# Patient Record
Sex: Female | Born: 1955 | Race: Black or African American | Hispanic: No | Smoking: Never smoker
Health system: Southern US, Community
[De-identification: ages and names within clinical notes are randomized; demographics above are authoritative.]

## PROBLEM LIST (undated history)

## (undated) DIAGNOSIS — Z9889 Other specified postprocedural states: Secondary | ICD-10-CM

## (undated) DIAGNOSIS — R7303 Prediabetes: Secondary | ICD-10-CM

## (undated) DIAGNOSIS — R51 Headache: Secondary | ICD-10-CM

## (undated) DIAGNOSIS — Z973 Presence of spectacles and contact lenses: Secondary | ICD-10-CM

## (undated) DIAGNOSIS — I10 Essential (primary) hypertension: Secondary | ICD-10-CM

## (undated) DIAGNOSIS — IMO0001 Reserved for inherently not codable concepts without codable children: Secondary | ICD-10-CM

## (undated) DIAGNOSIS — T8859XA Other complications of anesthesia, initial encounter: Secondary | ICD-10-CM

## (undated) DIAGNOSIS — R519 Headache, unspecified: Secondary | ICD-10-CM

## (undated) DIAGNOSIS — M1990 Unspecified osteoarthritis, unspecified site: Secondary | ICD-10-CM

## (undated) HISTORY — PX: NO PAST SURGERIES: SHX2092

---

## 2015-04-12 ENCOUNTER — Emergency Department (HOSPITAL_COMMUNITY)
Admission: EM | Admit: 2015-04-12 | Discharge: 2015-04-12 | Disposition: A | Discharge status: Home / Self Care | Payer: Medicaid Other | Attending: Emergency Medicine | Admitting: Emergency Medicine

## 2015-04-12 ENCOUNTER — Encounter (HOSPITAL_COMMUNITY): Payer: Self-pay | Admitting: Emergency Medicine

## 2015-04-12 ENCOUNTER — Emergency Department (HOSPITAL_COMMUNITY): Payer: Medicaid Other

## 2015-04-12 DIAGNOSIS — I10 Essential (primary) hypertension: Secondary | ICD-10-CM | POA: Diagnosis not present

## 2015-04-12 DIAGNOSIS — Z79899 Other long term (current) drug therapy: Secondary | ICD-10-CM | POA: Diagnosis not present

## 2015-04-12 DIAGNOSIS — M1711 Unilateral primary osteoarthritis, right knee: Secondary | ICD-10-CM

## 2015-04-12 DIAGNOSIS — Z880 Allergy status to penicillin: Secondary | ICD-10-CM | POA: Diagnosis not present

## 2015-04-12 DIAGNOSIS — M25561 Pain in right knee: Secondary | ICD-10-CM | POA: Diagnosis present

## 2015-04-12 DIAGNOSIS — M179 Osteoarthritis of knee, unspecified: Secondary | ICD-10-CM | POA: Diagnosis not present

## 2015-04-12 HISTORY — DX: Essential (primary) hypertension: I10

## 2015-04-12 MED ORDER — TRAMADOL HCL 50 MG PO TABS: 50.0000 mg | ORAL_TABLET | Freq: Four times a day (QID) | ORAL | Status: DC | PRN

## 2015-04-12 MED ORDER — CLONIDINE HCL 0.2 MG PO TABS
0.2000 mg | ORAL_TABLET | Freq: Once | ORAL | Status: AC
  Administered 2015-04-12: 0.2 mg via ORAL
  Filled 2015-04-12: qty 1

## 2015-04-12 MED ORDER — HYDROCODONE-ACETAMINOPHEN 5-325 MG PO TABS
1.0000 | ORAL_TABLET | ORAL | Status: AC
Start: 2015-04-12 — End: 2015-04-12
  Administered 2015-04-12: 1 via ORAL
  Filled 2015-04-12: qty 1

## 2015-04-12 NOTE — Discharge Instructions (Signed)
Emergency Department Resource Guide 1) Find a Doctor and Pay Out of Pocket Although you won't have to find out who is covered by your insurance plan, it is a good idea to ask around and get recommendations. You will then need to call the office and see if the doctor you have chosen will accept you as a new patient and what types of options they offer for patients who are self-pay. Some doctors offer discounts or will set up payment plans for their patients who do not have insurance, but you will need to ask so you aren't surprised when you get to your appointment.  2) Contact Your Local Health Department Not all health departments have doctors that can see patients for sick visits, but many do, so it is worth a call to see if yours does. If you don't know where your local health department is, you can check in your phone book. The CDC also has a tool to help you locate your state's health department, and many state websites also have listings of all of their local health departments.  3) Find a [facility] If your illness is not likely to be very severe or complicated, you may want to try a walk in clinic. These are popping up all over the country in pharmacies, drugstores, and shopping centers. They're usually staffed by nurse practitioners or physician assistants that have been trained to treat common illnesses and complaints. They're usually fairly quick and inexpensive. However, if you have serious medical issues or chronic medical problems, these are probably not your best option.   Chronic Pain Problems: Organization         Address     Phone             Notes  [facility]  [phone] Patients need to be referred by their primary care doctor.   Medication Assistance: Organization         Address     Phone             Notes  [facility] Medication [facility] [address] [place], [place] [address] [phone] --Must be a resident of  [facility] -- Must have NO insurance coverage whatsoever (no Medicaid/ Medicare, etc.) -- The pt. MUST have a primary care doctor that directs their care regularly and follows them in the community   Med[facility]ist  [phone]   [employer]  [phone]    Agencies that provide inexpensive medical care: Organization         Address     Phone             Notes  [place]  [phone]   [facility]    [phone]   [facility] [place], [place] [address] [phone]   [place] [address] [place] [phone]   Planned Parenthood    [phone]   [facility]    [phone]   [place] and [address], [place] Phone:  [phone], Fax:  [phone] Hours of Operation:  9 am - 6 pm, M-F.  Also accepts Medicaid/Medicare and self-pay.  [facility] for [place], [address], [place] Phone: [phone], Fax: [phone]. Hours of Operation:  8:30 am - 5:30 pm, M-F.  Also accepts Medicaid and self-pay.  [facility] [place] [address] [place] Phone: [phone]   [place], [place], [place] [phone], Ext. [phone] Mondays & Thursdays: 7-9 AM.  First 15 patients are seen on a first come, first serve basis.   [facility] [address] [place] [address] [phone] Accepts Medicaid   [place] Providers:  Organization         Address     Phone             Notes  [facility] [address], [place] [phone] Also accepts self-pay patients.  [facility] [id number] [place], [place]  [phone]   [place], [address], [place] [phone]   [facility]  [address], [place] [phone]   Lucianne Lei [address], [place]   [phone] Only accepts [place] Access [place] patients after they have their name applied to their card.   Self-Pay (no insurance) in [facility]:  Organization         Address     Phone             Notes  Sickle Cell Patients, [facility] [place] [phone]   [facility] [place] [phone]   [facility] [place]  [place], [place], [place] [phone]   [facility]/Dr. Osei-Bonsu  [address] [place] or [address], [place] [phone] Phone number for both [place] and [place] locations is the same.  Urgent Medical and [facility] [address] [place] [phone]   [facility] [address], [place] or [address] [phone]   [facility] [address] [place] [phone], phone; [phone], fax Sees patients 1st and 3rd Saturday of every month.  Must not qualify for public or private insurance (i.e. Medicaid, Medicare, [place] Health Choice, Veterans' Benefits)  Household income should be no more than 200% of the poverty level The clinic cannot treat you if you are pregnant or think you are pregnant  Sexually transmitted diseases are not treated at the clinic.    Dental Care:  Organization         Address     Phone             Notes  [facility] Department of [facility] [place] [phone] Accepts children up to age 37 who are enrolled in [place] or [place]; pregnant women with a Medicaid card; and children who have applied for Medicaid or [place] Health Choice, but were declined, whose parents can pay a reduced fee at time of service.  [facility] Department of [facility]  [address], [place] [phone] Accepts children up to age 36 who are enrolled in [place] or [place]; pregnant women with a Medicaid card; and children who have applied for Medicaid or [place] Health Choice, but were declined, whose parents can pay a reduced fee at time of service.  [facility] Adult [facility] Access PROGRAM  [place] [phone] Patients are seen by  appointment only. Walk-ins are not accepted. [place] will see patients 61 years of age and older. Monday - Tuesday (8am-5pm) Most Wednesdays (8:30-5pm) $30 per visit, cash only  [facility] Adult Dental Access PROGRAM  [address], [facility] [phone] Patients are seen by appointment only. Walk-ins are not accepted. [place] will see patients 46 years of age and older. One Wednesday Evening (Monthly: Volunteer Based).  $30 per visit, cash only  [place]  [phone] for adults; Children under age 22, call Graduate Pediatric Dentistry at [phone]. Children aged 70-14, please call [phone] to request a pediatric application.  Dental services are provided in all areas of dental care including fillings, crowns and bridges, complete and partial dentures, implants, gum treatment, root canals, and extractions. Preventive care is also provided. Treatment is provided to both adults and children. Patients are selected via a lottery and there is often a waiting list.   [facility] [address] [place]  [phone] [email or web address]   [facility] [address] [place], [place] [phone], Ext. [phone] Second and Fourth Thursday of each month, opens at 6:30 AM; Clinic ends at 9 AM.  Patients are seen on a first-come first-served basis, and a limited number are seen during each clinic.   [facility]  [address] [place], [place] [phone]   Eligibility Requirements You  must have lived in [place], [place], or [place] counties for at least the last three months.   You cannot be eligible for state or federal sponsored [employer], including [employer], [place], or [employer].   You generally cannot be eligible for healthcare insurance through your employer.    How to apply: Eligibility screenings are held every Tuesday and Wednesday afternoon from 1:00 pm until 4:00 pm. You do not need an appointment for the interview!  [facility] [address], [place], [place]   [place]  [place] Department  [place]  [phone]    Behavioral Health Resources in the Community: Intensive Outpatient Programs Organization         Address     Phone             Notes  [place]. [address] [place], [place] [phone]   [facility] Outpatient [address], [place], [place]   ADS: Alcohol & Drug Svcs [address] [place], [place]   [place] [address]  [place], [place] or [phone]     Substance Abuse Resources Organization         Address     Phone             Notes  Alcohol and Drug Services  [phone]   [place]  [phone]   [facility] [place]   Chinita Pester  [phone]   Residential & Outpatient Substance Abuse Program  [phone]   Psychological Services Organization         Address     Phone             Notes  [facility] [place]  [phone]   [place] [address] [place] or [phone]    Mobile Crisis Teams Organization  Address     Phone             Notes  [facility]rapeutic Alternatives, Mobile Crisis Care Unit  [phone]   Assertive Psychotherapeutic  Services  [address] [place], [place]   [facility] [address], [place] [phone]    Self-Help/Support Groups Organization         Address     Phone             Notes  [place]. of [place] - variety of support groups  [place] Call for more information  Narcotics Anonymous (NA), Caring Services [address], [employer] [place]  2 meetings at this location   Special educational needs teacher         Address     Phone             Notes  ASAP Residential Treatment [place],    [place]  [phone]   [facility]  [address], [place] [id number], [place], [place]   [place], [place] [phone] Admissions: 8am-3pm M-F  Incentives Substance [place] [address],    [place], [place] [phone]   [facility] [address] [place], [place], [place]   [facility] [address]  [place], [place]   Insight Programs - Intensive Outpatient [address] Kristeen [address], [place], [place]   [facility] ([place].) [place].,  [place], [place] [phone] or [phone]   Residential Treatment Services (RTS) [address] [place], [place] Accepts Medicaid  [facility] [place] [address]  [place] [phone] Substance Abuse/Addiction Treatment   [facility] Organization         Address     Phone             Notes  [facility]  [phone]   Domenic Schwab, PhD [address] [place], [place]   [phone] or [phone]   [place], [place] [phone]   [facility] [address], [place], [place] [phone] Insurance/Medicaid/sponsorship through [facility] and Families [address] [place]                                     [place], [place] [phone] [place] [address], [place] [phone]    Dr. Adele Schilder  [phone]   [facility] Dept. 1) [address] [place] 2) [place] 3)  [place] [address], [place] [phone]  [phone]   [place] [phone] or [phone] (After Hours)     [place]: Abuse and Market researcher         Address     Phone             Notes  Child/Elder Abuse Hotline  413-460-9469   Family Abuse Services  [phone] 24 hour crisis line  [place] Substance Use Organization  Address     Phone             Notes  [employer]   [phone] 24 hour crisis line  Advance Access  [address], [place] [phone] Monday- Friday, walk-in,  8am-8pm  RTSA Detoxification & Crisis Stabilization  [phone]   Alcoholics Anonymous [phone] [place] [place] [employer]  Narcotics Anonymous  [phone]     Health Clinics & Urgent Care Centers Organization         Address     Phone             Notes  [facility] [facility] Health Department  [phone]   [facility]  [phone]   [facility]  [phone]   [facility]  7703185107 Uninsured patients meeting eligibility requirement  [facility] Health Services      [facility]  [phone]      [place]     Health Center  [phone]      [facility]  [phone]      [facility]   [phone]     [facility]    Center  [phone]     Additional [facility] Resources Organization          Address     Phone             Notes  [facility]  [phone]   [place]  810-175-1025 Medicaid, Nutrition, Medicine Assistance, Utility Assistance  [employer] Authority (702)576-8538   [place] [facility]  814-756-4999   [facility]  434-347-1804 [facility] Shelter  [facility] [place]  712-458-0998 Adult & family shelter, food, utility & rent assistance  24 Hour crisis line for those facing homelessness  314-703-6201   [facility] Transit  507-046-8178 [place], [facility] public transportation system  Homecare Providers  [phone] HIV/AIDS Case Management, FREE HIV SCREEN  Medication Management  [phone] Ongoing medication assistance for patients meeting eligibility requirements  Medication Drop Box Locations: [place] Police Dept., [facility] Police Dept., [employer] Police Dept., [facility] office  Safely rid of unused medications  [employer]  ([phone])860-4326 Crisis assistance, medication, housing, food, utility assistance  [employer] Info.  [facility])  [phone]          Hypertension Hypertension, commonly called high blood pressure, is when the force of blood pumping through your arteries is too strong. Your arteries are the blood vessels that carry blood from your heart throughout your body. A blood pressure reading consists of a higher number over a lower number, such as 110/72. The higher number (systolic) is the pressure inside your arteries when your heart pumps. The lower number (diastolic) is the pressure inside your arteries when your heart relaxes. Ideally you want your blood pressure below 120/80. Hypertension forces your heart to work harder to pump blood. Your arteries may become narrow or stiff. Having hypertension puts you at risk for heart disease, stroke, and other problems.  RISK FACTORS Some risk factors for high blood  pressure are controllable. Others are not.  Risk factors you cannot control include:   Race. You may be at higher risk if you are African American.  Age. Risk increases with age.  Gender. Men are at higher risk than women before age 34 years. After age 77, women are at higher  risk than men. Risk factors you can control include:  Not getting enough exercise or physical activity.  Being overweight.  Getting too much fat, sugar, calories, or salt in your diet.  Drinking too much alcohol. SIGNS AND SYMPTOMS Hypertension does not usually cause signs or symptoms. Extremely high blood pressure (hypertensive crisis) may cause headache, anxiety, shortness of breath, and nosebleed. DIAGNOSIS  To check if you have hypertension, your health care provider will measure your blood pressure while you are seated, with your arm held at the level of your heart. It should be measured at least twice using the same arm. Certain conditions can cause a difference in blood pressure between your right and left arms. A blood pressure reading that is higher than normal on one occasion does not mean that you need treatment. If one blood pressure reading is high, ask your health care provider about having it checked again. TREATMENT  Treating high blood pressure includes making lifestyle changes and possibly taking medicine. Living a healthy lifestyle can help lower high blood pressure. You may need to change some of your habits. Lifestyle changes may include:  Following the DASH diet. This diet is high in fruits, vegetables, and whole grains. It is low in salt, red meat, and added sugars.  Getting at least 2 hours of brisk physical activity every week.  Losing weight if necessary.  Not smoking.  Limiting alcoholic beverages.  Learning ways to reduce stress. If lifestyle changes are not enough to get your blood pressure under control, your health care provider may prescribe medicine. You may need to take  more than one. Work closely with your health care provider to understand the risks and benefits. HOME CARE INSTRUCTIONS  Have your blood pressure rechecked as directed by your health care provider.   Take medicines only as directed by your health care provider. Follow the directions carefully. Blood pressure medicines must be taken as prescribed. The medicine does not work as well when you skip doses. Skipping doses also puts you at risk for problems.   Do not smoke.   Monitor your blood pressure at home as directed by your health care provider. SEEK MEDICAL CARE IF:   You think you are having a reaction to medicines taken.  You have recurrent headaches or feel dizzy.  You have swelling in your ankles.  You have trouble with your vision. SEEK IMMEDIATE MEDICAL CARE IF:  You develop a severe headache or confusion.  You have unusual weakness, numbness, or feel faint.  You have severe chest or abdominal pain.  You vomit repeatedly.  You have trouble breathing. MAKE SURE YOU:   Understand these instructions.  Will watch your condition.  Will get help right away if you are not doing well or get worse. Document Released: 10/27/2005 Document Revised: 03/13/2014 Document Reviewed: 08/19/2013 [facility] Patient Information 2015 [place], [place]. This information is not intended to replace advice given to you by your health care provider. Make sure you discuss any questions you have with your health care provider. Osteoarthritis Osteoarthritis is a disease that causes soreness and inflammation of a joint. It occurs when the cartilage at the affected joint wears down. Cartilage acts as a cushion, covering the ends of bones where they meet to form a joint. Osteoarthritis is the most common form of arthritis. It often occurs in older people. The joints affected most often by this condition include those in the:  Ends of the fingers.  Thumbs.  Neck.  Lower  back.  Knees.  Hips. CAUSES  Over time, the cartilage that covers the ends of bones begins to wear away. This causes bone to rub on bone, producing pain and stiffness in the affected joints.  RISK FACTORS Certain factors can increase your chances of having osteoarthritis, including:  Older age.  Excessive body weight.  Overuse of joints.  Previous joint injury. SIGNS AND SYMPTOMS   Pain, swelling, and stiffness in the joint.  Over time, the joint may lose its normal shape.  Small deposits of bone (osteophytes) may grow on the edges of the joint.  Bits of bone or cartilage can break off and float inside the joint space. This may cause more pain and damage. DIAGNOSIS  Your health care provider will do a physical exam and ask about your symptoms. Various tests may be ordered, such as:  X-rays of the affected joint.  An MRI scan.  Blood tests to rule out other types of arthritis.  Joint fluid tests. This involves using a needle to draw fluid from the joint and examining the fluid under a microscope. TREATMENT  Goals of treatment are to control pain and improve joint function. Treatment plans may include:  A prescribed exercise program that allows for rest and joint relief.  A weight control plan.  Pain relief techniques, such as:  Properly applied heat and cold.  Electric pulses delivered to nerve endings under the skin (transcutaneous electrical nerve stimulation [TENS]).  Massage.  Certain nutritional supplements.  Medicines to control pain, such as:  Acetaminophen.  Nonsteroidal anti-inflammatory drugs (NSAIDs), such as naproxen.  Narcotic or central-acting agents, such as tramadol.  Corticosteroids. These can be given orally or as an injection.  Surgery to reposition the bones and relieve pain (osteotomy) or to remove loose pieces of bone and cartilage. Joint replacement may be needed in advanced states of osteoarthritis. HOME CARE INSTRUCTIONS   Take  medicines only as directed by your health care provider.  Maintain a healthy weight. Follow your health care provider's instructions for weight control. This may include dietary instructions.  Exercise as directed. Your health care provider can recommend specific types of exercise. These may include:  Strengthening exercises. These are done to strengthen the muscles that support joints affected by arthritis. They can be performed with weights or with exercise bands to add resistance.  Aerobic activities. These are exercises, such as brisk walking or low-impact aerobics, that get your heart pumping.  Range-of-motion activities. These keep your joints limber.  Balance and agility exercises. These help you maintain daily living skills.  Rest your affected joints as directed by your health care provider.  Keep all follow-up visits as directed by your health care provider. SEEK MEDICAL CARE IF:   Your skin turns red.  You develop a rash in addition to your joint pain.  You have worsening joint pain.  You have a fever along with joint or muscle aches. SEEK IMMEDIATE MEDICAL CARE IF:  You have a significant loss of weight or appetite.  You have night sweats. FOR MORE INFORMATION   National Institute of Arthritis and Musculoskeletal and Skin Diseases: www.niams.http://www.myers.net/  [employer] on Aging: https://walker.com/  American College of Rheumatology: www.rheumatology.org Document Released: 10/27/2005 Document Revised: 03/13/2014 Document Reviewed: 07/04/2013 [facility] Patient Information 2015 [place], [place]. This information is not intended to replace advice given to you by your health care provider. Make sure you discuss any questions you have with your health care provider.

## 2015-04-12 NOTE — ED Notes (Signed)
Pt reports she injured her right knee five years ago, pt reports increase in pain today, denies new injury. Pt reports knot to her right outside knee. Pt is able to bare weight on her right leg however it is painful. Pt reports a HA as well since her knee started bothering her today. Hx of HTN, states she is on 4 different BP medications.

## 2015-04-12 NOTE — ED Provider Notes (Signed)
CSN: [id number]     Arrival date & time 04/12/15  1753 History   First MD Initiated Contact with Patient 04/12/15 1954     Chief Complaint  Patient presents with  . Knee Pain  . Headache    HPI Comments: Pain started several years ago.    Patient is a 59 y.o. female presenting with knee pain. The history is provided by the patient.  Knee Pain Location:  Knee Injury: no   Pain details:    Quality:  Aching and dull   Radiates to:  Does not radiate   Severity:  Moderate   Onset quality:  Gradual   Duration: Pain has been off and on for several years but significantly worse in the last week.   Timing:  Constant   Progression:  Worsening Relieved by:  Rest Worsened by:  Activity and bearing weight Ineffective treatments:  None tried Associated symptoms: stiffness   Associated symptoms: no muscle weakness and no numbness    the patient has also noticed a mild headache today. She does have a history of hypertension. She takes several medications including amlodipine clonidine hydrochlorothiazide and lisinopril  Past Medical History  Diagnosis Date  . Hypertension    History reviewed. No pertinent past surgical history. No family history on file. History  Substance Use Topics  . Smoking status: Never Smoker   . Smokeless tobacco: Not on file  . Alcohol Use: Yes   OB History    No data available     Review of Systems  Musculoskeletal: Positive for stiffness.  All other systems reviewed and are negative.     Allergies  Penicillins  Home Medications   Prior to Admission medications   Medication Sig Start Date End Date Taking? Authorizing Provider  acetaminophen (TYLENOL) 500 MG tablet Take 1,000 mg by mouth every 6 (six) hours as needed for mild pain.   Yes Historical Provider, MD  amLODipine (NORVASC) 10 MG tablet Take 10 mg by mouth daily.   Yes Historical Provider, MD  cloNIDine (CATAPRES) 0.2 MG tablet Take 0.2 mg by mouth 2 (two) times daily.   Yes Historical  Provider, MD  hydrochlorothiazide (HYDRODIURIL) 25 MG tablet Take 25 mg by mouth daily.   Yes Historical Provider, MD  lisinopril (PRINIVIL,ZESTRIL) 20 MG tablet Take 20 mg by mouth daily.   Yes Historical Provider, MD   BP 208/95 mmHg  Pulse 91  Temp(Src) 98.3 F (36.8 C) (Oral)  Resp 18  Ht  (1.727 m)  Wt 320 lb (145.151 kg)  BMI 48.67 kg/m2  SpO2 95% Physical Exam  Constitutional: She appears well-developed and well-nourished. No distress.  HENT:  Head: Normocephalic and atraumatic.  Right Ear: External ear normal.  Left Ear: External ear normal.  Eyes: Conjunctivae are normal. Right eye exhibits no discharge. Left eye exhibits no discharge. No scleral icterus.  Neck: Neck supple. No tracheal deviation present.  Cardiovascular: Normal rate, regular rhythm and intact distal pulses.   Pulmonary/Chest: Effort normal and breath sounds normal. No stridor. No respiratory distress. She has no wheezes. She has no rales.  Abdominal: Soft. Bowel sounds are normal. She exhibits no distension. There is no tenderness. There is no rebound and no guarding.  Musculoskeletal: She exhibits tenderness. She exhibits no edema.       Right knee: She exhibits no erythema and no LCL laxity. Tenderness found. Medial joint line and lateral joint line tenderness noted.  Neurological: She is alert. She has normal strength. No cranial nerve  deficit (no facial droop, extraocular movements intact, no slurred speech) or sensory deficit. She exhibits normal muscle tone. She displays no seizure activity. Coordination normal.  Skin: Skin is warm and dry. No rash noted.  Psychiatric: She has a normal mood and affect.  Nursing note and vitals reviewed.   ED Course  Procedures (including critical care time) Labs Review Labs Reviewed - No data to display  Imaging Review Dg Knee Complete 4 Views Right  04/12/2015   CLINICAL DATA:  Knee effusion.  Unable to bear weight.  EXAM: RIGHT KNEE - COMPLETE 4+ VIEW   COMPARISON:  None.  FINDINGS: Severe tricompartmental osteoarthritis, especially in the patellofemoral joint and medial compartment where there is severe loss of articular space. Prominent tricompartmental spurring.  Moderate knee effusion in the suprapatellar bursa. Suspected distal femoral osteochondroma of the anterior metaphysis. Possible free osteochondral fragment loose in the knee joint posterolaterally.  IMPRESSION: 1. Severe osteoarthritis. 2. Moderate knee effusion. 3. 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 4. Cannot exclude free osteochondral fragment in the knee joint.   Electronically Signed   By: Gaylyn RongWalter  Liebkemann M.D.   On: 04/12/2015 19:10     EKG Interpretation None      MDM   Final diagnoses:  Osteoarthritis of right knee, unspecified osteoarthritis type  Essential hypertension    Patient's x-ray show she has severe degenerative joint disease. Patient is morbidly obese and likely contributing to her knee arthritis.  Patient also has a mild headache but she is not showing any abnormalities in her neurologic exam. She does not have any meningismus. I doubt meningitis, stroke or cerebral hemorrhage.  The patient's blood pressure was initially elevated at 208. Her most recent blood pressure is improved at 153/74. Patient does not have a primary care doctor in this area. I will provide referral to a local primary care provider.  I will given him an orthopedic doctor to follow up with as needed.   Linwood DibblesJon Enyah Moman, MD 04/12/15 2046

## 2015-05-25 LAB — POCT GLUCOSE (DEVICE FOR HOME USE): Glucose Fasting, POC: 91 mg/dL (ref 70–99)

## 2015-07-02 ENCOUNTER — Encounter: Payer: Self-pay | Admitting: Dietician

## 2015-07-02 ENCOUNTER — Encounter: Payer: Medicaid Other | Attending: Family Medicine | Admitting: Dietician

## 2015-07-02 DIAGNOSIS — Z713 Dietary counseling and surveillance: Secondary | ICD-10-CM | POA: Insufficient documentation

## 2015-07-02 DIAGNOSIS — Z6841 Body Mass Index (BMI) 40.0 and over, adult: Secondary | ICD-10-CM | POA: Insufficient documentation

## 2015-07-02 NOTE — Patient Instructions (Signed)
Plan to eat 3 small meals per day.  For breakfast try fruit and eggs or Premier Protein shake and fruit or protein bar. Try to eat (drink protein shake) within an hour of waking up. Aim to fill half of your plate with vegetables, a quarter of your plate with protein, and a quarter of your plate with starch. Try using non-fat plain greek yogurt instead of sour cream on potatoes. Try [employer], [employer], or Healthy Choice frozen meals. Have protein with carbs for snacks (popcorn with cheese/nuts). For lunch, have a sandwich with whole bread bread with [place] and cheese. Add vegetables to side. Plan to use the exercise bike 2-3 x week and increase when you can. Think more about bariatric surgery. If you make an appointment with a surgeon, see if your daughter can go to the appointment.

## 2015-07-02 NOTE — Progress Notes (Signed)
  Medical Nutrition Therapy:  Appt start time: 1125 end time:  1215.   Assessment:  Primary concerns today: Asuncion is here today since she is trying to lose weight. Does not feel like she eats a lot and is not sure why she is gaining. Eats about 1 meal per day. "Gained 17 lbs in one week". Was at 318 lbs before that. Not sure if it is medication related. Stopped eating fried foods about a year ago. Stopped drinking beer but has 1 glass of wine on weekends. Was at the same weight (around 190 lbs) 3 years ago and started gaining after going on disability for arthritis in her knee.    Babysits during during the week (daytime). Lives with daughter and her granddaughter. Cooks her own meals since daughter works a lot. Buys a lot of frozen meals. Does not eat out. Has been eating the same way for the past year and a half. Does not eat sweets. Does not feel hunger.   Would like to lose 160 lbs. Doctor recommend that she have bariatric surgery but daughter said "it was too dangerous".   Will sometimes throw up after eating even though she doesn't feel sick. Not sure why.  Gets up around 8 AM.   Preferred Learning Style:   No preference indicated   Learning Readiness:   Ready  MEDICATIONS: see list   DIETARY INTAKE:  Usual eating pattern includes 2 meals and 0 snacks per day.  Avoided foods include bread, white beans, cauliflower, liver    24-hr recall:  B ( 11-12 AM): "mean green" shake  Snk ( AM): none  L ( PM): none Snk ( PM): none D ( 5 PM): frozen meals ([employer]), salmon and vegetables Snk ( PM): none or popcorn Beverages: water, red wine 2 glasses per week  Usual physical activity: none, though ordered an exercise bike  Estimated energy needs: 1600 calories 180 g carbohydrates 120 g protein 44 g fat  Progress Towards Goal(s):  In progress.   Nutritional Diagnosis:  [place]-3.3 Overweight/obesity As related to hx of meal skipping and physical inactivity.  As evidenced  by BMI of 49.2.    Intervention:  Nutrition counseling provided. Plan: Plan to eat 3 small meals per day.  For breakfast try fruit and eggs or Premier Protein shake and fruit or protein bar. Try to eat (drink protein shake) within an hour of waking up. Aim to fill half of your plate with vegetables, a quarter of your plate with protein, and a quarter of your plate with starch. Try using non-fat plain greek yogurt instead of sour cream on potatoes. Try [employer], [employer], or Healthy Choice frozen meals. Have protein with carbs for snacks (popcorn with cheese/nuts). For lunch, have a sandwich with whole bread bread with [place] and cheese. Add vegetables to side. Plan to use the exercise bike 2-3 x week and increase when you can. Think more about bariatric surgery. If you make an appointment with a surgeon, see if your daughter can go to the appointment.    Teaching Method Utilized:  Visual Auditory Hands on  Handouts given during visit include:  MyPlate Handout  15 g CHO Snacks  Meal Card  Barriers to learning/adherence to lifestyle change: knee pain  Demonstrated degree of understanding via:  Teach Back   Monitoring/Evaluation:  Dietary intake, exercise, and body weight in 1 month(s).

## 2015-08-06 ENCOUNTER — Ambulatory Visit: Payer: Medicaid Other | Admitting: Dietician

## 2015-11-26 ENCOUNTER — Telehealth: Payer: Self-pay

## 2015-11-26 NOTE — Telephone Encounter (Signed)
RECEIVED REFERRAL FROM [facility] FOR THIS [facility] - DR HARPER NOTED CANNOT SEE THIS PATIENT - FAXED BACK TO THE OFFICE ON 11/23/15

## 2016-03-07 ENCOUNTER — Other Ambulatory Visit: Payer: Self-pay | Admitting: Gastroenterology

## 2016-03-09 NOTE — Anesthesia Preprocedure Evaluation (Addendum)
Anesthesia Evaluation  Patient identified by MRN, date of birth, ID band Patient awake    Reviewed: Allergy & Precautions, NPO status , Patient's Chart, lab work & pertinent test results  Airway Mallampati: III   Neck ROM: Full    Dental  (+) Dental Advisory Given   Pulmonary neg pulmonary ROS,    breath sounds clear to auscultation       Cardiovascular hypertension, Pt. on medications negative cardio ROS   Rhythm:Regular     Neuro/Psych negative neurological ROS  negative psych ROS   GI/Hepatic negative GI ROS, Neg liver ROS,   Endo/Other  negative endocrine ROSMorbid obesityBMI 50  Renal/GU negative Renal ROS  negative genitourinary   Musculoskeletal negative musculoskeletal ROS (+)   Abdominal   Peds negative pediatric ROS (+)  Hematology negative hematology ROS (+)   Anesthesia Other Findings   Reproductive/Obstetrics negative OB ROS                            Anesthesia Physical Anesthesia Plan  ASA: III  Anesthesia Plan: MAC   Post-op Pain Management:    Induction: Intravenous  Airway Management Planned: Nasal Cannula  Additional Equipment:   Intra-op Plan:   Post-operative Plan:   Informed Consent: I have reviewed the patients History and Physical, chart, labs and discussed the procedure including the risks, benefits and alternatives for the proposed anesthesia with the patient or authorized representative who has indicated his/her understanding and acceptance.     Plan Discussed with:   Anesthesia Plan Comments:         Anesthesia Quick Evaluation

## 2016-03-10 ENCOUNTER — Encounter (HOSPITAL_COMMUNITY): Payer: Self-pay | Admitting: *Deleted

## 2016-03-11 ENCOUNTER — Other Ambulatory Visit: Payer: Self-pay | Admitting: Gastroenterology

## 2016-03-12 ENCOUNTER — Ambulatory Visit (HOSPITAL_COMMUNITY): Payer: Medicare PPO | Admitting: Anesthesiology

## 2016-03-12 ENCOUNTER — Encounter (HOSPITAL_COMMUNITY)
Admission: RE | Disposition: A | Discharge status: Home / Self Care | Payer: Self-pay | Source: Ambulatory Visit | Attending: Gastroenterology

## 2016-03-12 ENCOUNTER — Ambulatory Visit (HOSPITAL_COMMUNITY)
Admission: RE | Admit: 2016-03-12 | Discharge: 2016-03-12 | Disposition: A | Discharge status: Home / Self Care | Payer: Medicare PPO | Source: Ambulatory Visit | Attending: Gastroenterology | Admitting: Gastroenterology

## 2016-03-12 ENCOUNTER — Encounter (HOSPITAL_COMMUNITY): Payer: Self-pay | Admitting: Certified Registered Nurse Anesthetist

## 2016-03-12 DIAGNOSIS — Z79899 Other long term (current) drug therapy: Secondary | ICD-10-CM | POA: Diagnosis not present

## 2016-03-12 DIAGNOSIS — Z800 Family history of malignant neoplasm of digestive organs: Secondary | ICD-10-CM | POA: Diagnosis not present

## 2016-03-12 DIAGNOSIS — Z6841 Body Mass Index (BMI) 40.0 and over, adult: Secondary | ICD-10-CM | POA: Diagnosis not present

## 2016-03-12 DIAGNOSIS — I10 Essential (primary) hypertension: Secondary | ICD-10-CM | POA: Diagnosis not present

## 2016-03-12 DIAGNOSIS — Z1211 Encounter for screening for malignant neoplasm of colon: Secondary | ICD-10-CM | POA: Diagnosis not present

## 2016-03-12 DIAGNOSIS — K648 Other hemorrhoids: Secondary | ICD-10-CM | POA: Diagnosis not present

## 2016-03-12 DIAGNOSIS — K644 Residual hemorrhoidal skin tags: Secondary | ICD-10-CM | POA: Diagnosis not present

## 2016-03-12 DIAGNOSIS — K5730 Diverticulosis of large intestine without perforation or abscess without bleeding: Secondary | ICD-10-CM | POA: Diagnosis not present

## 2016-03-12 DIAGNOSIS — M1990 Unspecified osteoarthritis, unspecified site: Secondary | ICD-10-CM | POA: Insufficient documentation

## 2016-03-12 HISTORY — DX: Headache: R51

## 2016-03-12 HISTORY — PX: COLONOSCOPY WITH PROPOFOL: SHX5780

## 2016-03-12 HISTORY — DX: Headache, unspecified: R51.9

## 2016-03-12 HISTORY — DX: Unspecified osteoarthritis, unspecified site: M19.90

## 2016-03-12 HISTORY — DX: Reserved for inherently not codable concepts without codable children: IMO0001

## 2016-03-12 SURGERY — COLONOSCOPY WITH PROPOFOL
Anesthesia: Monitor Anesthesia Care

## 2016-03-12 MED ORDER — LIDOCAINE HCL (CARDIAC) 20 MG/ML IV SOLN
INTRAVENOUS | Status: DC | PRN
  Administered 2016-03-12: 100 mg via INTRAVENOUS

## 2016-03-12 MED ORDER — LIDOCAINE HCL (CARDIAC) 20 MG/ML IV SOLN
INTRAVENOUS | Status: AC
  Filled 2016-03-12: qty 5

## 2016-03-12 MED ORDER — LACTATED RINGERS IV SOLN
INTRAVENOUS | Status: DC
  Administered 2016-03-12: 08:00:00 via INTRAVENOUS

## 2016-03-12 MED ORDER — SODIUM CHLORIDE 0.9 % IV SOLN: INTRAVENOUS | Status: DC

## 2016-03-12 MED ORDER — PROPOFOL 10 MG/ML IV BOLUS
INTRAVENOUS | Status: AC
  Filled 2016-03-12: qty 60

## 2016-03-12 MED ORDER — ONDANSETRON HCL 4 MG/2ML IJ SOLN
INTRAMUSCULAR | Status: AC
  Filled 2016-03-12: qty 2

## 2016-03-12 MED ORDER — PROPOFOL 10 MG/ML IV BOLUS
INTRAVENOUS | Status: DC | PRN
  Administered 2016-03-12: 40 mg via INTRAVENOUS
  Administered 2016-03-12: 10 mg via INTRAVENOUS
  Administered 2016-03-12 (×2): 20 mg via INTRAVENOUS

## 2016-03-12 MED ORDER — PROPOFOL 500 MG/50ML IV EMUL
INTRAVENOUS | Status: DC | PRN
  Administered 2016-03-12: 50 ug/kg/min via INTRAVENOUS

## 2016-03-12 MED ORDER — ONDANSETRON HCL 4 MG/2ML IJ SOLN
INTRAMUSCULAR | Status: DC | PRN
  Administered 2016-03-12: 4 mg via INTRAVENOUS

## 2016-03-12 SURGICAL SUPPLY — 21 items

## 2016-03-12 NOTE — Discharge Instructions (Signed)

## 2016-03-12 NOTE — Anesthesia Postprocedure Evaluation (Signed)
Anesthesia Post Note  Patient: Mariah [name]  Procedure(s) Performed: Procedure(s) (LRB): COLONOSCOPY WITH PROPOFOL (N/A)  Patient location during evaluation: Endoscopy Anesthesia Type: MAC Level of consciousness: awake and alert Pain management: pain level controlled Vital Signs Assessment: post-procedure vital signs reviewed and stable Respiratory status: spontaneous breathing, nonlabored ventilation, respiratory function stable and patient connected to nasal cannula oxygen Cardiovascular status: stable and blood pressure returned to baseline Anesthetic complications: no    Last Vitals:  Filed Vitals:   03/12/16 0900 03/12/16 0910  BP: 126/71 128/74  Pulse: 72 67  Temp:    Resp: 15 18    Last Pain: There were no vitals filed for this visit.               Sebastian Acheheodore Ireland Chagnon

## 2016-03-12 NOTE — H&P (Signed)
Patient interval history reviewed.  Patient examined again.  There has been no change from documented H/P dated 02/06/16 (scanned into chart from our office) except as documented above.  Assessment:  1.  Colon cancer screening.  Plan:  1.  Colonoscopy. 2.  Risks (bleeding, infection, bowel perforation that could require surgery, sedation-related changes in cardiopulmonary systems), benefits (identification and possible treatment of source of symptoms, exclusion of certain causes of symptoms), and alternatives (watchful waiting, radiographic imaging studies, empiric medical treatment) of colonoscopy were explained to patient/family in detail and patient wishes to proceed.

## 2016-03-12 NOTE — Op Note (Signed)
[facility] Patient Name: Mariah [name] Procedure Date: 03/12/2016 MRN: [id number] Attending MD: [name] , MD Date of Birth: 1956-02-06 CSN:  Age: 60 Admit Type: Outpatient Procedure:                Colonoscopy Indications:              Screening in patient at increased risk: Family                            history of 1st-degree relative with colorectal                            cancer before age 49 years, This is the patient's                            first colonoscopy Providers:                [name], MD, Dow Adolph, RN, Clearnce Sorrel,                            Technician, Waymond Cera, CRNA Referring MD:             Julio Sicks, FNP Medicines:                Propofol per Anesthesia Complications:            No immediate complications. Estimated Blood Loss:     Estimated blood loss: none. Procedure:                Pre-Anesthesia Assessment:                           - Prior to the procedure, a History and Physical                            was performed, and patient medications and                            allergies were reviewed. The patient's tolerance of                            previous anesthesia was also reviewed. The risks                            and benefits of the procedure and the sedation                            options and risks were discussed with the patient.                            All questions were answered, and informed consent                            was obtained. Prior Anticoagulants: The patient has  taken no previous anticoagulant or antiplatelet                            agents. ASA Grade Assessment: III - A patient with                            severe systemic disease. After reviewing the risks                            and benefits, the patient was deemed in                            satisfactory condition to undergo the procedure.                           After obtaining  informed consent, the colonoscope                            was passed under direct vision. Throughout the                            procedure, the patient's blood pressure, pulse, and                            oxygen saturations were monitored continuously. The                            EC-[id number] ([id number]) scope was introduced through                            the anus and advanced to the the cecum, identified                            by appendiceal orifice and ileocecal valve. The                            colonoscopy was performed without difficulty. The                            patient tolerated the procedure well. The quality                            of the bowel preparation was adequate. The                            ileocecal valve, appendiceal orifice, and rectum                            were photographed. Scope In: 8:31:08 AM Scope Out: 8:46:00 AM Scope Withdrawal Time: 0 hours 9 minutes 32 seconds  Total Procedure Duration: 0 hours 14 minutes 52 seconds  Findings:      The perianal exam findings include non-thrombosed external hemorrhoids.      Internal hemorrhoids were found during retroflexion. The hemorrhoids  were mild.      No additional abnormalities were found on retroflexion.      A few medium-mouthed diverticula were found in the entire colon.      Colon otherwise normal; no other polyps, masses, vascular ectasias, or       inflammatory changes were seen. Impression:               - Non-thrombosed external hemorrhoids found on                            perianal exam.                           - Internal hemorrhoids.                           - Diverticulosis in the entire examined colon.                           - The examination was otherwise normal. Moderate Sedation:      N/A- Per Anesthesia Care Recommendation:           - Patient has a contact number available for                            emergencies. The signs and symptoms of potential                             delayed complications were discussed with the                            patient. Return to normal activities tomorrow.                            Written discharge instructions were provided to the                            patient.                           - Discharge patient to home (via wheelchair).                           - High fiber diet indefinitely.                           - Continue present medications.                           - Repeat colonoscopy in 5 years for screening                            purposes.                           - Return to GI clinic PRN.                           -  Return to referring physician as previously                            scheduled. Procedure Code(s):        --- Professional ---                           [phone], Colonoscopy, flexible; diagnostic, including                            collection of specimen(s) by brushing or washing,                            when performed (separate procedure) Diagnosis Code(s):        --- Professional ---                           Z80.0, Family history of malignant neoplasm of                            digestive organs                           K64.4, Residual hemorrhoidal skin tags                           K64.8, Other hemorrhoids                           K57.30, Diverticulosis of large intestine without                            perforation or abscess without bleeding CPT copyright 2016 American Medical Association. All rights reserved. The codes documented in this report are preliminary and upon coder review may  be revised to meet current compliance requirements. Willis ModenaWilliam Maridel Pixler, MD 03/12/2016 8:56:01 AM This report has been signed electronically. Number of Addenda: 0

## 2016-03-12 NOTE — Transfer of Care (Signed)
Immediate Anesthesia Transfer of Care Note  Patient: Mariah [name]  Procedure(s) Performed: Procedure(s): COLONOSCOPY WITH PROPOFOL (N/A)  Patient Location: ENDO  Anesthesia Type:MAC  Level of Consciousness:  sedated, patient cooperative and responds to stimulation  Airway & Oxygen Therapy:Patient Spontanous Breathing and Patient connected to face mask oxgen  Post-op Assessment:  Report given to ENDO RN and Post -op Vital signs reviewed and stable  Post vital signs:  Reviewed and stable  Last Vitals:  Filed Vitals:   03/12/16 0800  BP: 155/82  Pulse: 100  Temp: 36.7 C  Resp: 22    Complications: No apparent anesthesia complications

## 2016-03-16 ENCOUNTER — Encounter (HOSPITAL_COMMUNITY): Payer: Self-pay | Admitting: Gastroenterology

## 2016-04-21 IMAGING — CR DG KNEE COMPLETE 4+V*R*
4 series · 4 of 4 positions shown · non-contrast
Comparison: None.

CLINICAL DATA: Knee effusion.  Unable to bear weight.

EXAM:
RIGHT KNEE - COMPLETE 4+ VIEW

[knee ap]
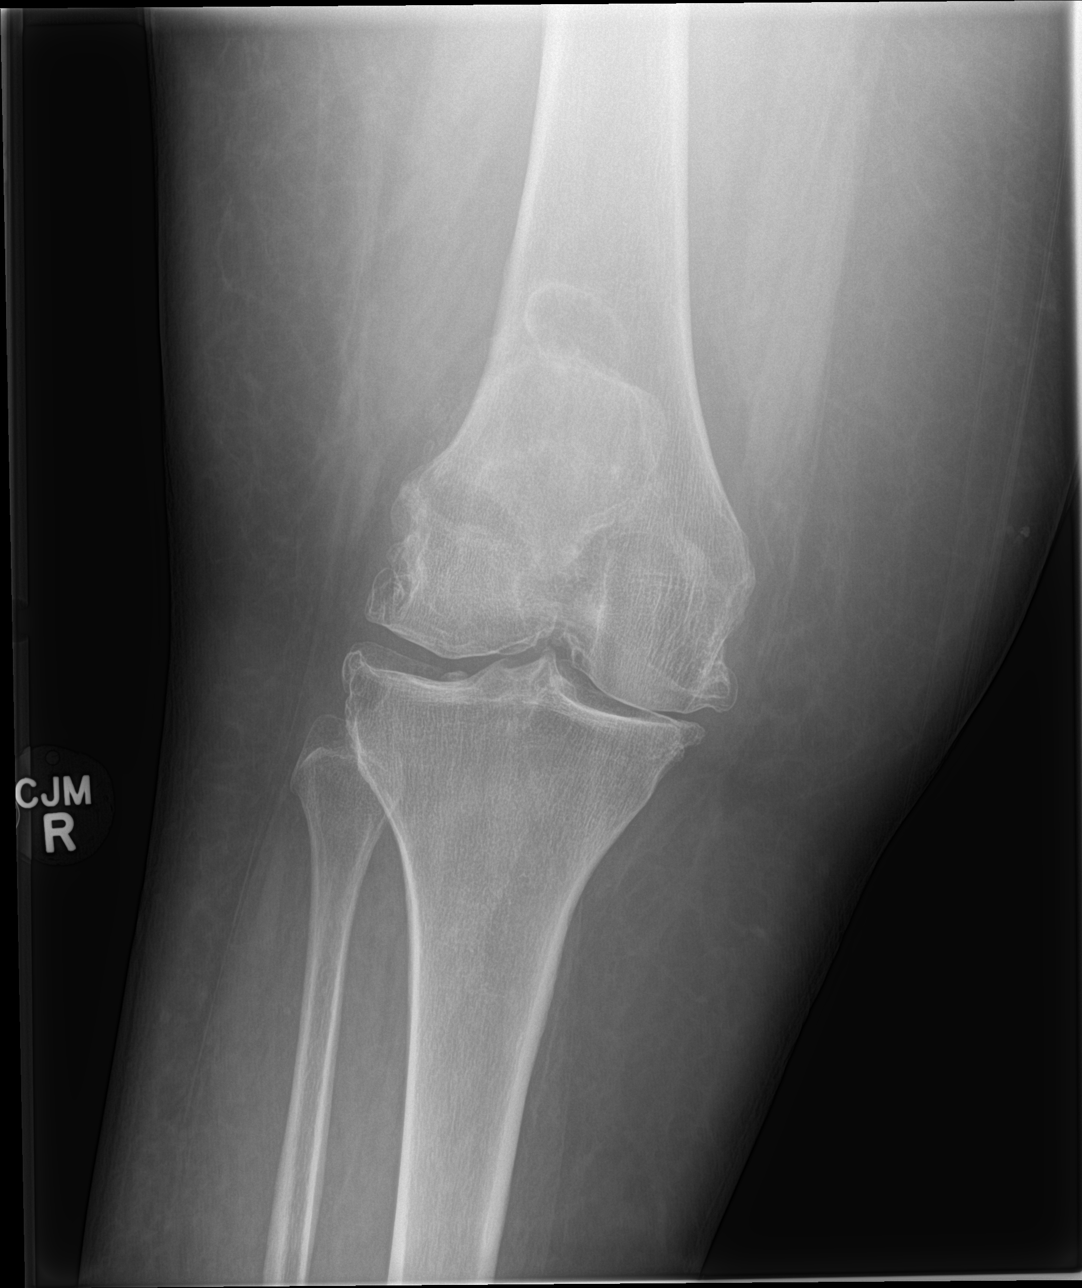

[knee lat]
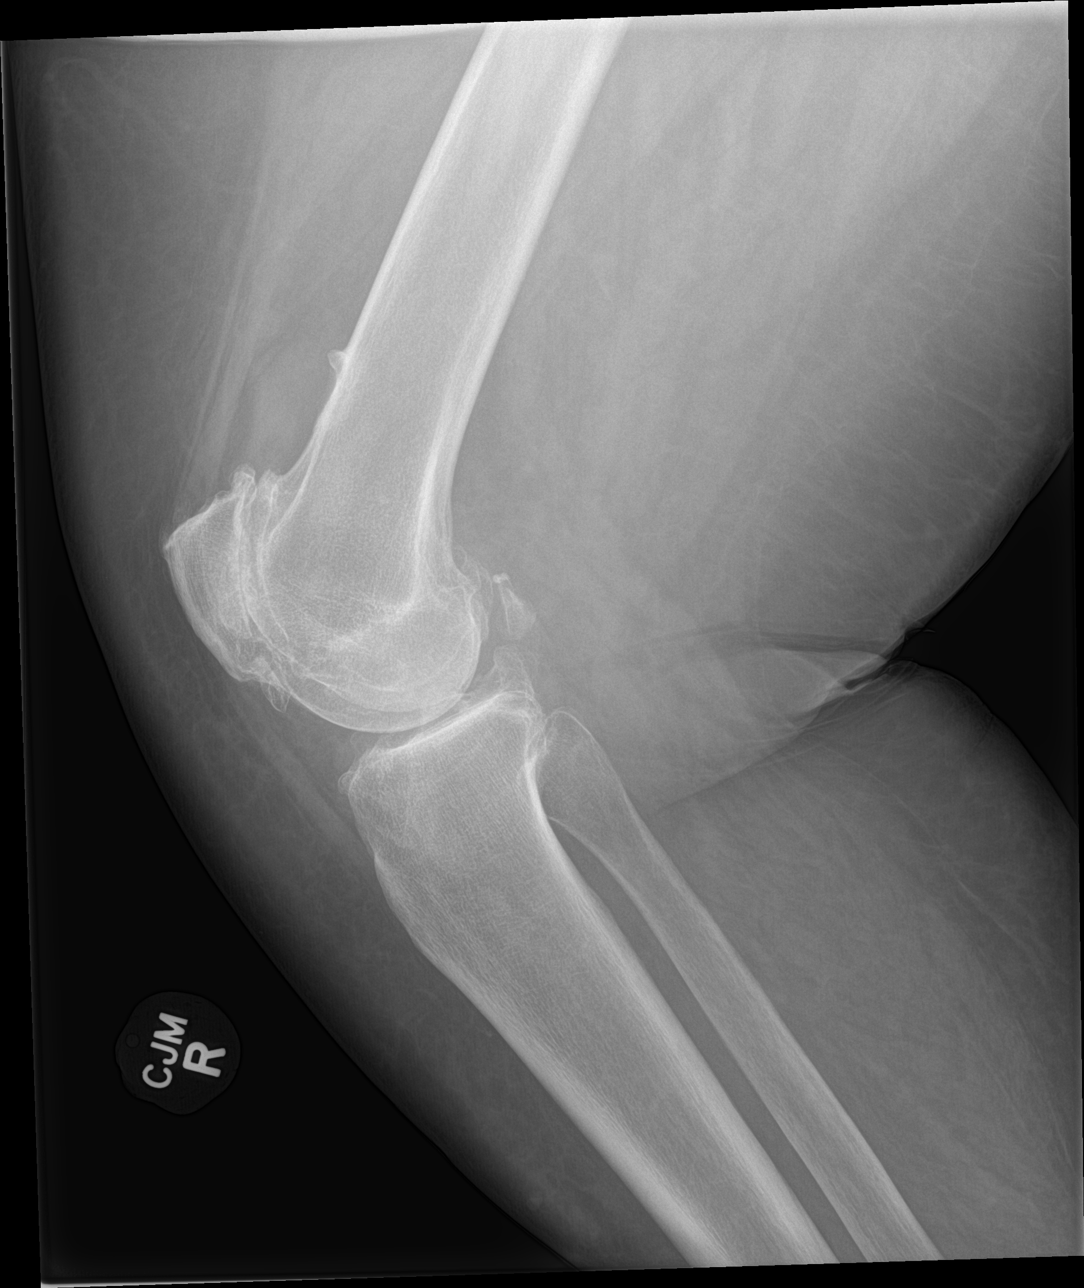

[knee obl (1 of 2)]
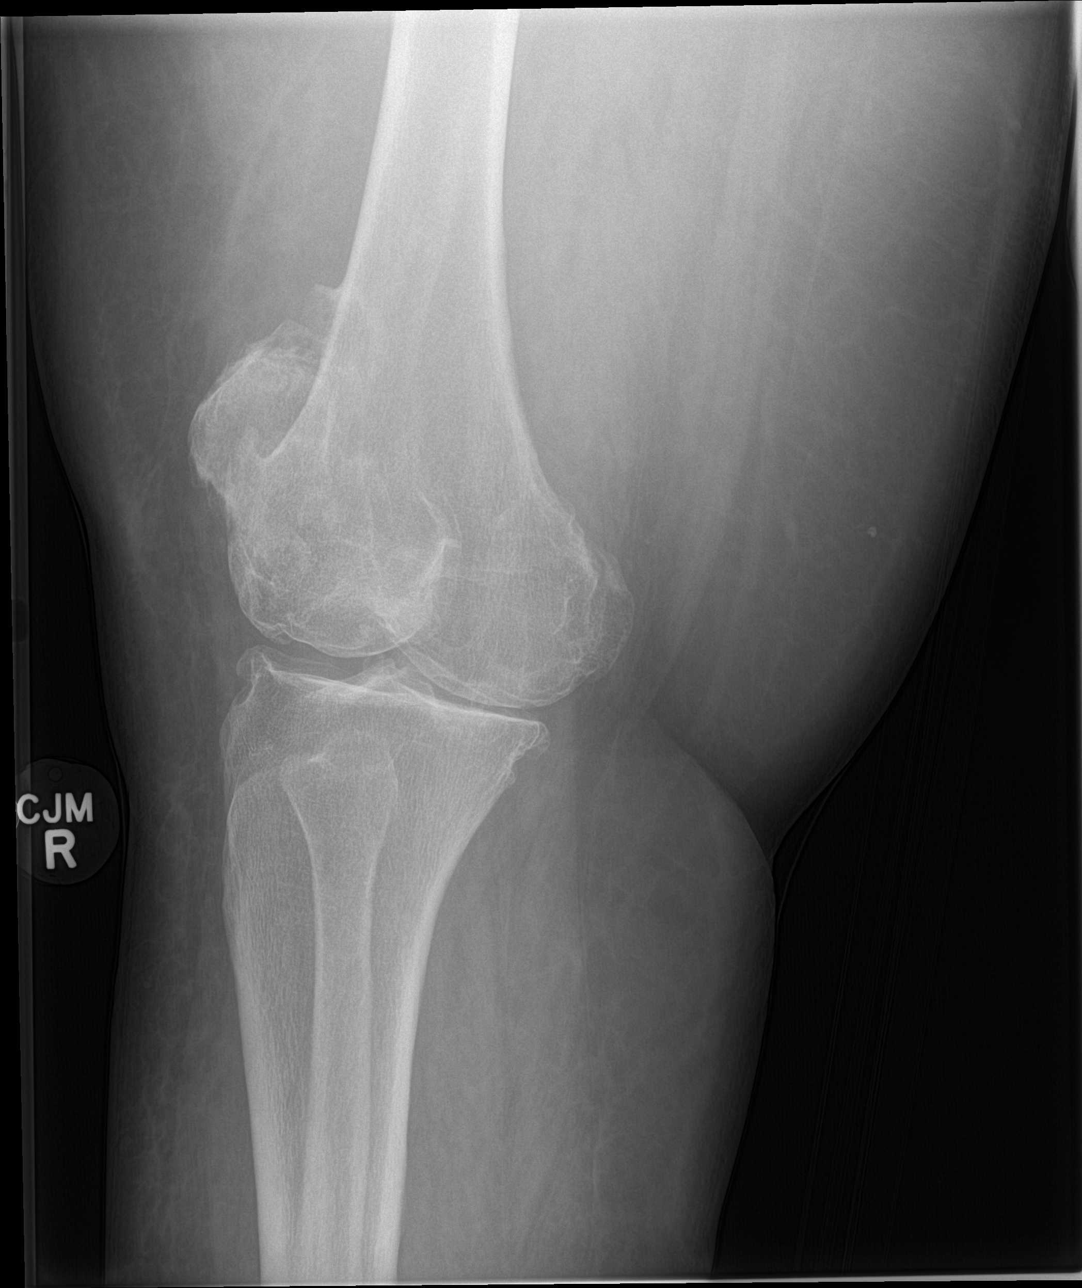

[knee obl (2 of 2)]
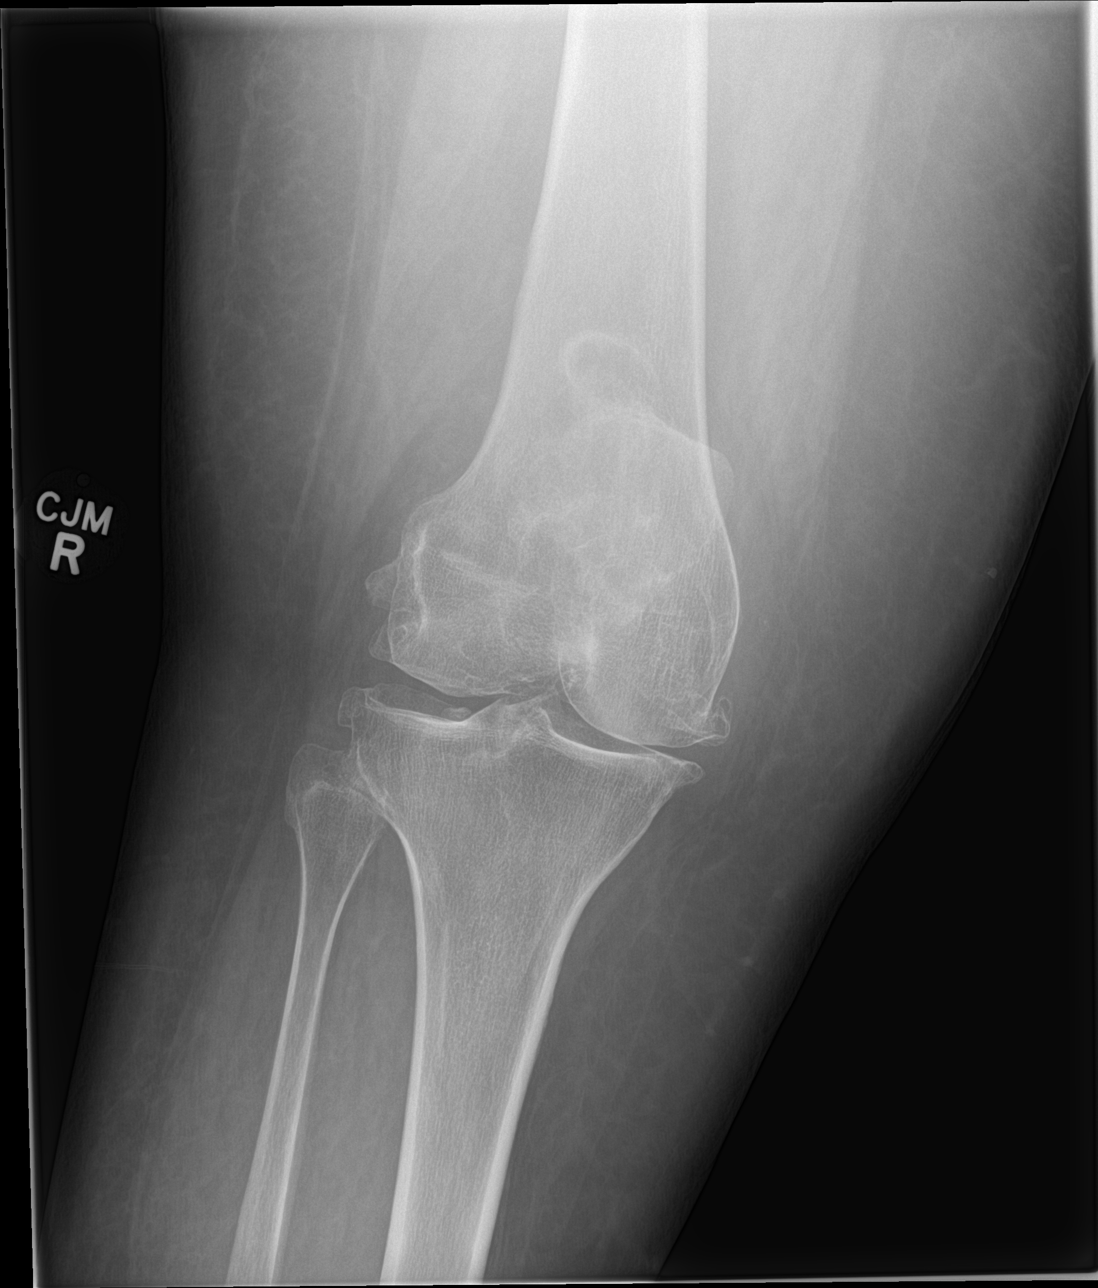

[4 of 4 positions shown; findings below may reference images not displayed]

FINDINGS: Severe tricompartmental osteoarthritis, especially in the
patellofemoral joint and medial compartment where there is severe
loss of articular space. Prominent tricompartmental spurring.

Moderate knee effusion in the suprapatellar bursa. Suspected distal
femoral osteochondroma of the anterior metaphysis. Possible free
osteochondral fragment loose in the knee joint posterolaterally.
IMPRESSION: 1. Severe osteoarthritis.
2. Moderate knee effusion.
3. Probable distal femoral osteochondroma.
4. Cannot exclude free osteochondral fragment in the knee joint.

## 2018-04-19 ENCOUNTER — Other Ambulatory Visit: Payer: Self-pay

## 2018-04-19 ENCOUNTER — Encounter (HOSPITAL_COMMUNITY): Payer: Self-pay

## 2018-04-19 ENCOUNTER — Emergency Department (HOSPITAL_COMMUNITY)
Admission: EM | Admit: 2018-04-19 | Discharge: 2018-04-19 | Disposition: A | Discharge status: Home / Self Care | Payer: Medicare HMO | Attending: Emergency Medicine | Admitting: Emergency Medicine

## 2018-04-19 DIAGNOSIS — R51 Headache: Secondary | ICD-10-CM

## 2018-04-19 DIAGNOSIS — H5711 Ocular pain, right eye: Secondary | ICD-10-CM | POA: Diagnosis present

## 2018-04-19 DIAGNOSIS — R519 Headache, unspecified: Secondary | ICD-10-CM

## 2018-04-19 DIAGNOSIS — Z79899 Other long term (current) drug therapy: Secondary | ICD-10-CM | POA: Insufficient documentation

## 2018-04-19 DIAGNOSIS — G44209 Tension-type headache, unspecified, not intractable: Secondary | ICD-10-CM | POA: Insufficient documentation

## 2018-04-19 DIAGNOSIS — I10 Essential (primary) hypertension: Secondary | ICD-10-CM | POA: Diagnosis not present

## 2018-04-19 DIAGNOSIS — H1131 Conjunctival hemorrhage, right eye: Secondary | ICD-10-CM | POA: Diagnosis not present

## 2018-04-19 LAB — I-STAT CHEM 8, ED
BUN: 21 mg/dL — AB (ref 6–20)
CHLORIDE: 101 mmol/L (ref 101–111)
CREATININE: 0.7 mg/dL (ref 0.44–1.00)
Calcium, Ion: 1.19 mmol/L (ref 1.15–1.40)
Glucose, Bld: 101 mg/dL — ABNORMAL HIGH (ref 65–99)
HEMATOCRIT: 39 % (ref 36.0–46.0)
HEMOGLOBIN: 13.3 g/dL (ref 12.0–15.0)
POTASSIUM: 3.2 mmol/L — AB (ref 3.5–5.1)
Sodium: 140 mmol/L (ref 135–145)
TCO2: 28 mmol/L (ref 22–32)

## 2018-04-19 MED ORDER — TETRACAINE HCL 0.5 % OP SOLN
1.0000 [drp] | Freq: Once | OPHTHALMIC | Status: AC
  Administered 2018-04-19: 1 [drp] via OPHTHALMIC
  Filled 2018-04-19: qty 4

## 2018-04-19 MED ORDER — KETOROLAC TROMETHAMINE 30 MG/ML IJ SOLN
30.0000 mg | Freq: Once | INTRAMUSCULAR | Status: AC
  Administered 2018-04-19: 30 mg via INTRAMUSCULAR
  Filled 2018-04-19: qty 1

## 2018-04-19 MED ORDER — ACETAMINOPHEN 325 MG PO TABS
650.0000 mg | ORAL_TABLET | Freq: Once | ORAL | Status: DC
  Filled 2018-04-19: qty 2

## 2018-04-19 NOTE — ED Provider Notes (Signed)
Patient placed in Quick Look pathway, seen and evaluated   Chief Complaint: right eye pain and reedness  HPI: Mariah [name] is a 62 y.o. female who present to the ED with right eye redness and pain.  Patient states that she takes her BP meds as directed and today checked it and found her BP high, states that she has had mild head pain and redness to right eye sclera, no trauma. Patient saw her eye doctor 2 weeks ago and he told her to use drops OTC for eye redness. Patient concerned about pain behind her right eye and the redness. Also about her elevated BP.   ROS: Eyes: right eye redness and pain  Resp: shortness of breath when going up steps  Physical Exam:  BP (!) 180/97 (BP Location: Left Arm)   Pulse 61   Temp 97.9 F (36.6 C) (Oral)   Resp 16   SpO2 100%    Gen: No distress  Neuro: Awake and Alert  Skin: Warm and dry  Eye: Right eye with redness of sclarea Subconjunctival hemorrhage    Initiation of care has begun. The patient has been counseled on the process, plan, and necessity for staying for the completion/evaluation, and the remainder of the medical screening examination    Janne Napoleoneese, Hope M, NP 04/19/18 1528    Derwood KaplanNanavati, Ankit, MD 04/21/18 [phone]

## 2018-04-19 NOTE — ED Notes (Signed)
Pt verbalized understanding discharge instructions and denies any further needs or questions at this time. VS stable, ambulatory and steady gait.   

## 2018-04-19 NOTE — ED Provider Notes (Signed)
[facility] Provider Note   CSN: [id number] Arrival date & time: 04/19/18  1328     History   Chief Complaint No chief complaint on file.   HPI Mariah [name] is a 62 y.o. female.  HPI   62 year old female presents today with several complaints.  Patient notes that her last several weeks she has had some minor blurred vision and intermittent pain in her right eye.  She notes some redness in the eye.  She was seen by her primary care and referred to ophthalmology.  She is placed on moisturizing eyedrops uncertain diagnosis.  She notes that this has persisted.  She notes the pain is intermittent in nature and is not having any significant pain in her eye presently.  Patient notes she is also had difficulty managing her blood pressure, last week had medication change, now taking metoprolol valsartan and chlorthalidone.  She notes that this was helping her blood pressure but woke up this morning and noted she was hypertensive.  Patient also notes a pain to the left posterior head, throbbing in nature, no radiation symptoms, no neck pain, no neurological deficits, no fever.  Patient also notes some minor nasal congestion as well.  Patient did not take any medication for her headache today.    Past Medical History:  Diagnosis Date  . Arthritis    oa  . Headache   . Hypertension   . Shortness of breath dyspnea    with exertion    There are no active problems to display for this patient.   Past Surgical History:  Procedure Laterality Date  . COLONOSCOPY WITH PROPOFOL N/A 03/12/2016   Procedure: COLONOSCOPY WITH PROPOFOL;  Surgeon: Willis ModenaWilliam Outlaw, MD;  Location: [facility];  Service: Endoscopy;  Laterality: N/A;  . NO PAST SURGERIES       OB History   None      Home Medications    Prior to Admission medications   Medication Sig Start Date End Date Taking? Authorizing Provider  acetaminophen (TYLENOL) 500 MG tablet Take 1,000 mg by  mouth every 6 (six) hours as needed for mild pain.    [provider]  amLODipine (NORVASC) 10 MG tablet Take 10 mg by mouth daily.    [provider]  CALCIUM CARBONATE PO Take 1 tablet by mouth daily.    [provider]  Cholecalciferol (VITAMIN D3 PO) Take 1 tablet by mouth daily.    [provider]  cloNIDine (CATAPRES) 0.2 MG tablet Take 0.2 mg by mouth 2 (two) times daily. Takes at 500 pm and 1000 pm    [provider]  hydrochlorothiazide (HYDRODIURIL) 25 MG tablet Take 25 mg by mouth daily.    [provider]  lisinopril (PRINIVIL,ZESTRIL) 20 MG tablet Take 20 mg by mouth daily.    [provider]  Multiple Vitamin (MULTIVITAMIN WITH MINERALS) TABS tablet Take 1 tablet by mouth daily.    [provider]    Family History No family history on file.  Social History Social History   Tobacco Use  . Smoking status: Never Smoker  . Smokeless tobacco: Never Used  Substance Use Topics  . Alcohol use: Yes    Comment: occc wine  . Drug use: No     Allergies   Penicillins   Review of Systems Review of Systems  All other systems reviewed and are negative.  Physical Exam Updated Vital Signs BP (!) 155/95   Pulse (!) 59  Temp 97.9 F (36.6 C) (Oral)   Resp 16   SpO2 100%   Physical Exam  Constitutional: She is oriented to person, place, and time. She appears well-developed and well-nourished.  HENT:  Head: Normocephalic and atraumatic.  Eyes: Pupils are equal, round, and reactive to light. Conjunctivae are normal. Right eye exhibits no discharge. Left eye exhibits no discharge. No scleral icterus.  Sub-conjunctival hemorrhage noted to the left sclera-equals equal round reactive to light, extraocular movements intact, intraocular pressure 18 right, 15 left, no discharge-vision intact bilateral  Neck: Normal range of motion. No JVD present. No tracheal deviation present.  Pulmonary/Chest: Effort normal.  No stridor.  Neurological: She is alert and oriented to person, place, and time. She has normal strength. No cranial nerve deficit or sensory deficit. Coordination normal. GCS eye subscore is 4. GCS verbal subscore is 5. GCS motor subscore is 6.  Psychiatric: She has a normal mood and affect. Her behavior is normal. Judgment and thought content normal.  Nursing note and vitals reviewed.    ED Treatments / Results  Labs (all labs ordered are listed, but only abnormal results are displayed) Labs Reviewed  I-STAT CHEM 8, ED - Abnormal; Notable for the following components:      Result Value   Potassium 3.2 (*)    BUN 21 (*)    Glucose, Bld 101 (*)    All other components within normal limits    EKG None  Radiology No results found.  Procedures Procedures (including critical care time)  Medications Ordered in ED Medications  tetracaine (PONTOCAINE) 0.5 % ophthalmic solution 1 drop (1 drop Both Eyes Given 04/19/18 1714)  ketorolac (TORADOL) 30 MG/ML injection 30 mg (30 mg Intramuscular Given 04/19/18 1714)     Initial Impression / Assessment and Plan / ED Course  I have reviewed the triage vital signs and the nursing notes.  Pertinent labs & imaging results that were available during my care of the patient were reviewed by me and considered in my medical decision making (see chart for details).     Labs: I-STAT Chem-8  Imaging:  Consults:  Therapeutics: toradol   Discharge Meds:   Assessment/Plan: 62-year-old female presents today with several complaints.  Patient has subconjunctival hemorrhage on the right.  She has been seen within the last week by ophthalmology, no signs of increased intraocular pressure trauma or deficits of the eye.  Patient also having headache, this is posterior left with no neurological deficits, low suspicion for acute intracranial abnormality.  Patient is hypertensive, her blood pressure cuff was readjusted with improvement in her blood pressure  at 164/112.  Patient has had difficulty managing her blood pressure and has recently had medication change.  I do not find that her blood pressures elevated enough that would indicate need for acute management.  Patient will be discharged with outpatient primary care follow-up, ophthalmology follow-up and strict return precautions.  Patient verbalized understanding and agreement to today's plan.    Final Clinical Impressions(s) / ED Diagnoses   Final diagnoses:  Subconjunctival hemorrhage of right eye  Acute nonintractable headache, unspecified headache type  Hypertension, unspecified type    ED Discharge Orders    None       Eyvonne Mechanic, PA-C 04/19/18 2017    Rolan Bucco, MD 04/19/18 2204

## 2018-04-19 NOTE — ED Triage Notes (Signed)
Patient states that she takes her BP meds as directed and today checked it and found her BP high, states that she has had mild head pain and redness to right eye sclera, no trauma

## 2018-04-19 NOTE — Discharge Instructions (Addendum)
Please read attached information. If you experience any new or worsening signs or symptoms please return to the emergency room for evaluation. Please follow-up with your primary care provider or specialist as discussed. Please use medication prescribed only as directed and discontinue taking if you have any concerning signs or symptoms.   °

## 2018-04-19 NOTE — ED Notes (Signed)
ED Provider at bedside. 

## 2018-06-22 ENCOUNTER — Encounter

## 2019-06-20 ENCOUNTER — Ambulatory Visit (INDEPENDENT_AMBULATORY_CARE_PROVIDER_SITE_OTHER): Payer: Medicare HMO | Admitting: Registered Nurse

## 2019-06-20 ENCOUNTER — Encounter: Payer: Self-pay | Admitting: Registered Nurse

## 2019-06-20 ENCOUNTER — Other Ambulatory Visit: Payer: Self-pay

## 2019-06-20 VITALS — BP 138/86 | HR 71 | Temp 98.0°F | Resp 16 | Ht 65.16 in | Wt 324.0 lb

## 2019-06-20 DIAGNOSIS — I10 Essential (primary) hypertension: Secondary | ICD-10-CM | POA: Diagnosis not present

## 2019-06-20 DIAGNOSIS — Z13228 Encounter for screening for other metabolic disorders: Secondary | ICD-10-CM | POA: Diagnosis not present

## 2019-06-20 DIAGNOSIS — Z6841 Body Mass Index (BMI) 40.0 and over, adult: Secondary | ICD-10-CM | POA: Diagnosis not present

## 2019-06-20 DIAGNOSIS — Z1329 Encounter for screening for other suspected endocrine disorder: Secondary | ICD-10-CM

## 2019-06-20 DIAGNOSIS — Z130 Encounter for screening for diseases of the blood and blood-forming organs and certain disorders involving the immune mechanism: Secondary | ICD-10-CM | POA: Diagnosis not present

## 2019-06-20 DIAGNOSIS — Z13220 Encounter for screening for lipoid disorders: Secondary | ICD-10-CM

## 2019-06-20 MED ORDER — CHLORTHALIDONE 25 MG PO TABS: 25.0000 mg | ORAL_TABLET | Freq: Every day | ORAL | 1 refills | Status: DC

## 2019-06-20 MED ORDER — AMLODIPINE BESYLATE 10 MG PO TABS: 10.0000 mg | ORAL_TABLET | Freq: Every day | ORAL | 1 refills | Status: DC

## 2019-06-20 MED ORDER — METOPROLOL SUCCINATE ER 100 MG PO TB24: 100.0000 mg | ORAL_TABLET | Freq: Every day | ORAL | 1 refills | Status: DC

## 2019-06-20 NOTE — Patient Instructions (Signed)
° ° ° °  If you have lab work done today you will be contacted with your lab results within the next 2 weeks.  If you have not heard from us then please contact us. The fastest way to get your results is to register for My Chart. ° ° °IF you received an x-ray today, you will receive an invoice from [facility]. Please contact [facility] at [phone] with questions or concerns regarding your invoice.  ° °IF you received labwork today, you will receive an invoice from [facility]. Please contact [facility] at [phone] with questions or concerns regarding your invoice.  ° °Our billing staff will not be able to assist you with questions regarding bills from these companies. ° °You will be contacted with the lab results as soon as they are available. The fastest way to get your results is to activate your My Chart account. Instructions are located on the last page of this paperwork. If you have not heard from us regarding the results in 2 weeks, please contact this office. °  ° ° ° °

## 2019-06-20 NOTE — Progress Notes (Signed)
Established Patient Office Visit  Subjective:  Patient ID: Mariah [name], female    DOB: [birth year]  Age: 63 y.o. MRN: [id number]  CC:  Chief Complaint  Patient presents with  . Establish Care    need new pcp to manage medications and Chronic Conditions     HPI Mariah [name] presents for visit to establish care and medication refills Managed for HTN, taking amlodipine, metoprolol, and chlorthalidone. She has run out of her chlorthalidone for around 3 days. Her BP was somewhat elevated upon arrival, but has come down to 138/88. This is a comfortable reading to continue her current regimen.  She reports her only current medical concern is that she has 3 teeth that she needs pulled.  She has an appt on Wednesday with her dentist.  Past Medical History:  Diagnosis Date  . Arthritis    oa  . Headache   . Hypertension   . Shortness of breath dyspnea    with exertion    Past Surgical History:  Procedure Laterality Date  . COLONOSCOPY WITH PROPOFOL N/A 03/12/2016   Procedure: COLONOSCOPY WITH PROPOFOL;  Surgeon: Arta Silence, MD;  Location: [facility];  Service: Endoscopy;  Laterality: N/A;  . NO PAST SURGERIES      No family history on file.  Social History   Socioeconomic History  . Marital status: Widowed    Spouse name: Not on file  . Number of children: Not on file  . Years of education: Not on file  . Highest education level: Not on file  Occupational History  . Not on file  Social Needs  . Financial resource strain: Not on file  . Food insecurity    Worry: Not on file    Inability: Not on file  . Transportation needs    Medical: Not on file    Non-medical: Not on file  Tobacco Use  . Smoking status: Never Smoker  . Smokeless tobacco: Never Used  Substance and Sexual Activity  . Alcohol use: Yes    Comment: occc wine  . Drug use: No  . Sexual activity: Not on file  Lifestyle  . Physical activity    Days per week: Not on file    Minutes per session:  Not on file  . Stress: Not on file  Relationships  . Social Herbalist on phone: Not on file    Gets together: Not on file    Attends religious service: Not on file    Active member of club or organization: Not on file    Attends meetings of clubs or organizations: Not on file    Relationship status: Not on file  . Intimate partner violence    Fear of current or ex partner: Not on file    Emotionally abused: Not on file    Physically abused: Not on file    Forced sexual activity: Not on file  Other Topics Concern  . Not on file  Social History Narrative  . Not on file    Outpatient Medications Prior to Visit  Medication Sig Dispense Refill  . acetaminophen (TYLENOL) 500 MG tablet Take 1,000 mg by mouth every 6 (six) hours as needed for mild pain.    Marland Kitchen CALCIUM CARBONATE PO Take 1 tablet by mouth daily.    . Cholecalciferol (VITAMIN D3 PO) Take 1 tablet by mouth daily.    . cloNIDine (CATAPRES) 0.2 MG tablet Take 0.2 mg by mouth 2 (two) times daily. Takes at 500  pm and 1000 pm    . hydrochlorothiazide (HYDRODIURIL) 25 MG tablet Take 25 mg by mouth daily.    Marland Kitchen. lisinopril (PRINIVIL,ZESTRIL) 20 MG tablet Take 20 mg by mouth daily.    . Multiple Vitamin (MULTIVITAMIN WITH MINERALS) TABS tablet Take 1 tablet by mouth daily.    . valsartan (DIOVAN) 320 MG tablet     . amLODipine (NORVASC) 10 MG tablet Take 10 mg by mouth daily.    . chlorthalidone (HYGROTON) 25 MG tablet Take 25 mg by mouth daily.    . metoprolol succinate (TOPROL-XL) 100 MG 24 hr tablet      No facility-administered medications prior to visit.     Allergies  Allergen Reactions  . Penicillins Shortness Of Breath, Swelling and Other (See Comments)    Has patient had a PCN reaction causing immediate rash, facial/tongue/throat swelling, SOB or lightheadedness with hypotension: yes Has patient had a PCN reaction causing severe rash involving mucus membranes or skin necrosis: no Has patient had a PCN reaction  that required hospitalization no Has patient had a PCN reaction occurring within the last 10 years: no If all of the above answers are "NO", then may proceed with Cephalosporin use.     ROS Review of Systems  Constitutional: Negative.   HENT: Negative.   Eyes: Negative.   Respiratory: Negative.   Cardiovascular: Negative.   Gastrointestinal: Negative.   Endocrine: Negative.   Genitourinary: Negative.   Musculoskeletal: Negative.   Skin: Negative.   Allergic/Immunologic: Negative.   Neurological: Negative.   Hematological: Negative.   Psychiatric/Behavioral: Negative.   All other systems reviewed and are negative.     Objective:    Physical Exam  Constitutional: She is oriented to person, place, and time. She appears well-developed and well-nourished. No distress.  Cardiovascular: Normal rate and regular rhythm.  Pulmonary/Chest: Effort normal. No respiratory distress.  Neurological: She is alert and oriented to person, place, and time.  Skin: Skin is warm and dry. No rash noted. She is not diaphoretic. No erythema. No pallor.  Psychiatric: She has a normal mood and affect. Her behavior is normal. Judgment and thought content normal.  Nursing note and vitals reviewed.   BP (!) 160/84   Pulse 71   Temp 98 F (36.7 C) (Oral)   Resp 16   Ht 5' 5.16" (1.655 m)   Wt (!) 324 lb (147 kg)   SpO2 98%   BMI 53.66 kg/m  Wt Readings from Last 3 Encounters:  06/20/19 (!) 324 lb (147 kg)  03/12/16 (!) 323 lb 4.8 oz (146.6 kg)  07/02/15 (!) 323 lb 4.8 oz (146.6 kg)     Health Maintenance Due  Topic Date Due  . Hepatitis C Screening  Apr 01, 1956  . HIV Screening  11/21/1972  . TETANUS/TDAP  11/21/1976  . PAP SMEAR-Modifier  11/21/1978  . MAMMOGRAM  11/22/2007  . INFLUENZA VACCINE  06/11/2019    There are no preventive care reminders to display for this patient.  No results found for: TSH Lab Results  Component Value Date   HGB 13.3 04/19/2018   HCT 39.0 04/19/2018    Lab Results  Component Value Date   NA 140 04/19/2018   K 3.2 (L) 04/19/2018   GLUCOSE 101 (H) 04/19/2018   BUN 21 (H) 04/19/2018   CREATININE 0.70 04/19/2018   No results found for: CHOL No results found for: HDL No results found for: LDLCALC No results found for: TRIG No results found for: CHOLHDL No results found  for: HGBA1C    Assessment & Plan:   Problem List Items Addressed This Visit    None    Visit Diagnoses    Screening for endocrine, metabolic and immunity disorder    -  Primary   Relevant Orders   CBC   Comprehensive metabolic panel   Hemoglobin A1c   TSH   Lipid screening       Relevant Orders   Lipid panel   Essential hypertension       Relevant Medications   valsartan (DIOVAN) 320 MG tablet   chlorthalidone (HYGROTON) 25 MG tablet   metoprolol succinate (TOPROL-XL) 100 MG 24 hr tablet   amLODipine (NORVASC) 10 MG tablet      Meds ordered this encounter  Medications  . chlorthalidone (HYGROTON) 25 MG tablet    Sig: Take 1 tablet (25 mg total) by mouth daily.    Dispense:  90 tablet    Refill:  1    Order Specific Question:   Supervising Provider    Answer:   Collie SiadSTALLINGS, ZOE A [id number]]  . metoprolol succinate (TOPROL-XL) 100 MG 24 hr tablet    Sig: Take 1 tablet (100 mg total) by mouth daily.    Dispense:  90 tablet    Refill:  1    Order Specific Question:   Supervising Provider    Answer:   Collie SiadSTALLINGS, ZOE A [id number]]  . amLODipine (NORVASC) 10 MG tablet    Sig: Take 1 tablet (10 mg total) by mouth daily.    Dispense:  90 tablet    Refill:  1    Order Specific Question:   Supervising Provider    Answer:   Doristine BosworthSTALLINGS, ZOE A [id number]]    Follow-up: No follow-ups on file.   PLAN  Labs drawn, will follow up on results  Refilled meds x 6 mos  BP under control today  Patient encouraged to call clinic with any questions, comments, or concerns.    Janeece Ageeichard Daylene Vandenbosch, NP

## 2019-06-21 LAB — COMPREHENSIVE METABOLIC PANEL
ALT: 20 IU/L (ref 0–32)
AST: 27 IU/L (ref 0–40)
Albumin/Globulin Ratio: 1.3 (ref 1.2–2.2)
Albumin: 4 g/dL (ref 3.8–4.8)
Alkaline Phosphatase: 58 IU/L (ref 39–117)
BUN/Creatinine Ratio: 15 (ref 12–28)
BUN: 13 mg/dL (ref 8–27)
Bilirubin Total: 0.3 mg/dL (ref 0.0–1.2)
CO2: 24 mmol/L (ref 20–29)
Calcium: 9.8 mg/dL (ref 8.7–10.3)
Chloride: 100 mmol/L (ref 96–106)
Creatinine, Ser: 0.85 mg/dL (ref 0.57–1.00)
GFR calc Af Amer: 86 mL/min/{1.73_m2} (ref >59)
GFR calc non Af Amer: 74 mL/min/{1.73_m2} (ref >59)
Globulin, Total: 3.1 g/dL (ref 1.5–4.5)
Glucose: 111 mg/dL — ABNORMAL HIGH (ref 65–99)
Potassium: 3.3 mmol/L — ABNORMAL LOW (ref 3.5–5.2)
Sodium: 141 mmol/L (ref 134–144)
Total Protein: 7.1 g/dL (ref 6.0–8.5)

## 2019-06-21 LAB — CBC
Hematocrit: 35.4 % (ref 34.0–46.6)
Hemoglobin: 12.1 g/dL (ref 11.1–15.9)
MCH: 28.3 pg (ref 26.6–33.0)
MCHC: 34.2 g/dL (ref 31.5–35.7)
MCV: 83 fL (ref 79–97)
Platelets: 298 10*3/uL (ref 150–450)
RBC: 4.27 x10E6/uL (ref 3.77–5.28)
RDW: 13.2 % (ref 11.7–15.4)
WBC: 5.9 10*3/uL (ref 3.4–10.8)

## 2019-06-21 LAB — LIPID PANEL
Chol/HDL Ratio: 3.4 ratio (ref 0.0–4.4)
Cholesterol, Total: 189 mg/dL (ref 100–199)
HDL: 56 mg/dL (ref >39)
LDL Calculated: 110 mg/dL — ABNORMAL HIGH (ref 0–99)
Triglycerides: 117 mg/dL (ref 0–149)
VLDL Cholesterol Cal: 23 mg/dL (ref 5–40)

## 2019-06-21 LAB — HEMOGLOBIN A1C
Est. average glucose Bld gHb Est-mCnc: 117 mg/dL
Hgb A1c MFr Bld: 5.7 % — ABNORMAL HIGH (ref 4.8–5.6)

## 2019-06-21 LAB — TSH: TSH: 1.17 u[IU]/mL (ref 0.450–4.500)

## 2019-06-21 NOTE — Progress Notes (Signed)
Overall no concerns. Mild elevations in A1c, LDL, and glucose - we already discussed diet and exercise at our visit. Otherwise, mild decrease in potassium likely benign, will be resolved with improved diet. Improved from pervious K reading.  Kathrin Ruddy, NP

## 2019-06-22 DIAGNOSIS — R69 Illness, unspecified: Secondary | ICD-10-CM | POA: Diagnosis not present

## 2019-07-07 DIAGNOSIS — H5203 Hypermetropia, bilateral: Secondary | ICD-10-CM | POA: Diagnosis not present

## 2019-07-20 ENCOUNTER — Encounter: Payer: Self-pay | Admitting: Registered Nurse

## 2019-07-25 ENCOUNTER — Telehealth: Payer: Self-pay | Admitting: Registered Nurse

## 2019-07-25 NOTE — Telephone Encounter (Signed)
HUMANA HAS BEEN CALLING PATIENT ABOUT HER PRESCRIPTION AND SHE WOULD LIKE FOR SOMEONE TO GIVE HER A CALL BACK IMMEDIATELY   [phone]  ZALSARTAN 320 MG patient needs this medication filled

## 2019-07-29 ENCOUNTER — Telehealth: Payer: Self-pay

## 2019-07-29 ENCOUNTER — Other Ambulatory Visit: Payer: Self-pay | Admitting: Registered Nurse

## 2019-07-29 DIAGNOSIS — I10 Essential (primary) hypertension: Secondary | ICD-10-CM

## 2019-07-29 MED ORDER — VALSARTAN 320 MG PO TABS: 320.0000 mg | ORAL_TABLET | Freq: Every day | ORAL | 1 refills | Status: DC

## 2019-07-29 NOTE — Telephone Encounter (Signed)
Pt states that she is not on amlodipine. pls review. I see not not supporting that. Sent valsartan to pt pharmacy

## 2019-07-29 NOTE — Telephone Encounter (Signed)
CORRECTION valsartan was not sent to [facility] due to no sig. Pls reveiw

## 2019-07-29 NOTE — Telephone Encounter (Signed)
Pt is asking for the valsartan be sent to [facility] for refill.SHe is also stating that she is not on the amlodipine anymore. Sent message to [place] for review.

## 2019-07-29 NOTE — Telephone Encounter (Signed)
I have corrected this and sent it to [facility].  Thank you for the heads up  Kathrin Ruddy, NP

## 2019-08-02 ENCOUNTER — Other Ambulatory Visit: Payer: Self-pay

## 2019-08-02 DIAGNOSIS — Z20822 Contact with and (suspected) exposure to covid-19: Secondary | ICD-10-CM

## 2019-08-03 LAB — NOVEL CORONAVIRUS, NAA: SARS-CoV-2, NAA: NOT DETECTED

## 2019-08-29 DIAGNOSIS — Z6841 Body Mass Index (BMI) 40.0 and over, adult: Secondary | ICD-10-CM | POA: Diagnosis not present

## 2019-08-29 DIAGNOSIS — Z124 Encounter for screening for malignant neoplasm of cervix: Secondary | ICD-10-CM | POA: Diagnosis not present

## 2019-08-29 DIAGNOSIS — Z1231 Encounter for screening mammogram for malignant neoplasm of breast: Secondary | ICD-10-CM | POA: Diagnosis not present

## 2019-08-29 DIAGNOSIS — E669 Obesity, unspecified: Secondary | ICD-10-CM | POA: Insufficient documentation

## 2019-08-29 DIAGNOSIS — M1990 Unspecified osteoarthritis, unspecified site: Secondary | ICD-10-CM | POA: Insufficient documentation

## 2019-08-29 DIAGNOSIS — Z01419 Encounter for gynecological examination (general) (routine) without abnormal findings: Secondary | ICD-10-CM | POA: Diagnosis not present

## 2019-09-01 ENCOUNTER — Encounter: Payer: Self-pay | Admitting: Registered Nurse

## 2019-09-19 ENCOUNTER — Encounter: Payer: Self-pay | Admitting: Registered Nurse

## 2019-09-20 ENCOUNTER — Other Ambulatory Visit: Payer: Self-pay

## 2019-09-20 ENCOUNTER — Ambulatory Visit (INDEPENDENT_AMBULATORY_CARE_PROVIDER_SITE_OTHER): Payer: Medicare HMO | Admitting: Registered Nurse

## 2019-09-20 ENCOUNTER — Encounter: Payer: Self-pay | Admitting: Registered Nurse

## 2019-09-20 VITALS — BP 140/82 | HR 69 | Temp 98.3°F | Resp 16 | Wt 327.0 lb

## 2019-09-20 DIAGNOSIS — Z23 Encounter for immunization: Secondary | ICD-10-CM | POA: Diagnosis not present

## 2019-09-20 DIAGNOSIS — I10 Essential (primary) hypertension: Secondary | ICD-10-CM

## 2019-09-20 MED ORDER — HYDROCHLOROTHIAZIDE 25 MG PO TABS: 25.0000 mg | ORAL_TABLET | Freq: Every day | ORAL | 3 refills | Status: DC

## 2019-09-20 MED ORDER — VALSARTAN 320 MG PO TABS: 320.0000 mg | ORAL_TABLET | Freq: Every day | ORAL | 3 refills | Status: DC

## 2019-09-20 MED ORDER — METOPROLOL SUCCINATE ER 100 MG PO TB24: 100.0000 mg | ORAL_TABLET | Freq: Every day | ORAL | 3 refills | Status: DC

## 2019-09-20 NOTE — Patient Instructions (Signed)
° ° ° °  If you have lab work done today you will be contacted with your lab results within the next 2 weeks.  If you have not heard from us then please contact us. The fastest way to get your results is to register for My Chart. ° ° °IF you received an x-ray today, you will receive an invoice from [facility]. Please contact [facility] at [phone] with questions or concerns regarding your invoice.  ° °IF you received labwork today, you will receive an invoice from [facility]. Please contact [facility] at [phone] with questions or concerns regarding your invoice.  ° °Our billing staff will not be able to assist you with questions regarding bills from these companies. ° °You will be contacted with the lab results as soon as they are available. The fastest way to get your results is to activate your My Chart account. Instructions are located on the last page of this paperwork. If you have not heard from us regarding the results in 2 weeks, please contact this office. °  ° ° ° °

## 2019-09-20 NOTE — Progress Notes (Signed)
Established Patient Office Visit  Subjective:  Patient ID: Mariah SpecterLetitia Fullenwider, female    DOB: 12-07-1955  Age: 63 y.o. MRN: [id number]  CC:  Chief Complaint  Patient presents with  . Hypertension    3 month follow- up     HPI Mariah [name] presents for HTN Follow up  Reports that she has been taking her BP at home and it has been 130s/80s. It is 140/82 in office today, but she reports she was stressed trying to make it to this appt on time.   Denies symptoms of HTN or adverse cardiovascular event including chest pain, shob, headache, visual changes, dependent edema.  Otherwise feels well. Requests flu shot today.  Past Medical History:  Diagnosis Date  . Arthritis    oa  . Headache   . Hypertension   . Shortness of breath dyspnea    with exertion    Past Surgical History:  Procedure Laterality Date  . COLONOSCOPY WITH PROPOFOL N/A 03/12/2016   Procedure: COLONOSCOPY WITH PROPOFOL;  Surgeon: Willis ModenaWilliam Outlaw, MD;  Location: [facility];  Service: Endoscopy;  Laterality: N/A;  . NO PAST SURGERIES      History reviewed. No pertinent family history.  Social History   Socioeconomic History  . Marital status: Widowed    Spouse name: Not on file  . Number of children: Not on file  . Years of education: Not on file  . Highest education level: Not on file  Occupational History  . Not on file  Social Needs  . Financial resource strain: Not on file  . Food insecurity    Worry: Not on file    Inability: Not on file  . Transportation needs    Medical: Not on file    Non-medical: Not on file  Tobacco Use  . Smoking status: Never Smoker  . Smokeless tobacco: Never Used  Substance and Sexual Activity  . Alcohol use: Yes    Comment: occc wine  . Drug use: No  . Sexual activity: Not on file  Lifestyle  . Physical activity    Days per week: Not on file    Minutes per session: Not on file  . Stress: Not on file  Relationships  . Social Musicianconnections    Talks on phone: Not  on file    Gets together: Not on file    Attends religious service: Not on file    Active member of club or organization: Not on file    Attends meetings of clubs or organizations: Not on file    Relationship status: Not on file  . Intimate partner violence    Fear of current or ex partner: Not on file    Emotionally abused: Not on file    Physically abused: Not on file    Forced sexual activity: Not on file  Other Topics Concern  . Not on file  Social History Narrative  . Not on file    Outpatient Medications Prior to Visit  Medication Sig Dispense Refill  . CALCIUM CARBONATE PO Take 1 tablet by mouth daily.    . chlorthalidone (HYGROTON) 25 MG tablet Take 1 tablet (25 mg total) by mouth daily. 90 tablet 1  . Cholecalciferol (VITAMIN D3 PO) Take 1 tablet by mouth daily.    . metoprolol succinate (TOPROL-XL) 100 MG 24 hr tablet Take 1 tablet (100 mg total) by mouth daily. 90 tablet 1  . Multiple Vitamin (MULTIVITAMIN WITH MINERALS) TABS tablet Take 1 tablet by mouth daily.    .Marland Kitchen  valsartan (DIOVAN) 320 MG tablet Take 1 tablet (320 mg total) by mouth daily. 90 tablet 1  . hydrochlorothiazide (HYDRODIURIL) 25 MG tablet Take 25 mg by mouth daily.    Marland Kitchen acetaminophen (TYLENOL) 500 MG tablet Take 1,000 mg by mouth every 6 (six) hours as needed for mild pain.    Marland Kitchen amLODipine (NORVASC) 10 MG tablet Take 1 tablet (10 mg total) by mouth daily. 90 tablet 1  . cloNIDine (CATAPRES) 0.2 MG tablet Take 0.2 mg by mouth 2 (two) times daily. Takes at 500 pm and 1000 pm    . lisinopril (PRINIVIL,ZESTRIL) 20 MG tablet Take 20 mg by mouth daily.     No facility-administered medications prior to visit.     Allergies  Allergen Reactions  . Penicillins Shortness Of Breath, Swelling and Other (See Comments)    Has patient had a PCN reaction causing immediate rash, facial/tongue/throat swelling, SOB or lightheadedness with hypotension: yes Has patient had a PCN reaction causing severe rash involving mucus  membranes or skin necrosis: no Has patient had a PCN reaction that required hospitalization no Has patient had a PCN reaction occurring within the last 10 years: no If all of the above answers are "NO", then may proceed with Cephalosporin use.     ROS Review of Systems  Constitutional: Negative.  Negative for unexpected weight change.  HENT: Negative.   Eyes: Negative.   Respiratory: Negative.  Negative for chest tightness and shortness of breath.   Cardiovascular: Negative.  Negative for chest pain, palpitations and leg swelling.  Gastrointestinal: Negative.   Endocrine: Negative.   Genitourinary: Negative.   Musculoskeletal: Negative.   Skin: Negative.   Allergic/Immunologic: Negative.   Neurological: Negative.  Negative for headaches.  Hematological: Negative.   Psychiatric/Behavioral: Negative.   All other systems reviewed and are negative.     Objective:    Physical Exam  Constitutional: She is oriented to person, place, and time. She appears well-developed and well-nourished. No distress.  Cardiovascular: Normal rate and regular rhythm.  Pulmonary/Chest: Effort normal. No respiratory distress.  Musculoskeletal:        General: No edema.  Neurological: She is alert and oriented to person, place, and time.  Skin: Skin is warm and dry. No rash noted. She is not diaphoretic. No erythema. No pallor.  Psychiatric: She has a normal mood and affect. Her behavior is normal. Judgment and thought content normal.  Nursing note and vitals reviewed.   BP 140/82   Pulse 69   Temp 98.3 F (36.8 C) (Oral)   Resp 16   Wt (!) 327 lb (148.3 kg)   SpO2 99%   BMI 54.15 kg/m  Wt Readings from Last 3 Encounters:  09/20/19 (!) 327 lb (148.3 kg)  06/20/19 (!) 324 lb (147 kg)  03/12/16 (!) 323 lb 4.8 oz (146.6 kg)     Health Maintenance Due  Topic Date Due  . Hepatitis C Screening  15-May-1956  . HIV Screening  11/21/1972  . TETANUS/TDAP  11/21/1976    There are no  preventive care reminders to display for this patient.  Lab Results  Component Value Date   TSH 1.170 06/20/2019   Lab Results  Component Value Date   WBC 5.9 06/20/2019   HGB 12.1 06/20/2019   HCT 35.4 06/20/2019   MCV 83 06/20/2019   PLT 298 06/20/2019   Lab Results  Component Value Date   NA 141 06/20/2019   K 3.3 (L) 06/20/2019   CO2 24 06/20/2019  GLUCOSE 111 (H) 06/20/2019   BUN 13 06/20/2019   CREATININE 0.85 06/20/2019   BILITOT 0.3 06/20/2019   ALKPHOS 58 06/20/2019   AST 27 06/20/2019   ALT 20 06/20/2019   PROT 7.1 06/20/2019   ALBUMIN 4.0 06/20/2019   CALCIUM 9.8 06/20/2019   Lab Results  Component Value Date   CHOL 189 06/20/2019   Lab Results  Component Value Date   HDL 56 06/20/2019   Lab Results  Component Value Date   LDLCALC 110 (H) 06/20/2019   Lab Results  Component Value Date   TRIG 117 06/20/2019   Lab Results  Component Value Date   CHOLHDL 3.4 06/20/2019   Lab Results  Component Value Date   HGBA1C 5.7 (H) 06/20/2019      Assessment & Plan:   Problem List Items Addressed This Visit      Cardiovascular and Mediastinum   Hypertensive disorder - Primary   Relevant Medications   hydrochlorothiazide (HYDRODIURIL) 25 MG tablet   metoprolol succinate (TOPROL-XL) 100 MG 24 hr tablet   valsartan (DIOVAN) 320 MG tablet    Other Visit Diagnoses    Need for influenza vaccination       Relevant Orders   Influenza (Seasonal) (Completed)      Meds ordered this encounter  Medications  . hydrochlorothiazide (HYDRODIURIL) 25 MG tablet    Sig: Take 1 tablet (25 mg total) by mouth daily.    Dispense:  90 tablet    Refill:  3    Order Specific Question:   Supervising Provider    Answer:   Collie Siad A [id number]  . metoprolol succinate (TOPROL-XL) 100 MG 24 hr tablet    Sig: Take 1 tablet (100 mg total) by mouth daily.    Dispense:  90 tablet    Refill:  3    Order Specific Question:   Supervising Provider    Answer:    Collie Siad A [id number]  . valsartan (DIOVAN) 320 MG tablet    Sig: Take 1 tablet (320 mg total) by mouth daily.    Dispense:  90 tablet    Refill:  3    Order Specific Question:   Supervising Provider    Answer:   Doristine Bosworth [id number]    Follow-up: No follow-ups on file.   PLAN  BP wnl today, borderline though. She should continue to monitor at home and monitor for symptoms - should any arise, return to clinic  Otherwise, med refill for 1 year  Flu vaccine given today  Patient encouraged to call clinic with any questions, comments, or concerns.   Janeece Agee, NP

## 2019-11-14 ENCOUNTER — Other Ambulatory Visit: Payer: Self-pay | Admitting: Registered Nurse

## 2019-11-14 DIAGNOSIS — I10 Essential (primary) hypertension: Secondary | ICD-10-CM

## 2019-12-01 ENCOUNTER — Telehealth: Payer: Self-pay | Admitting: *Deleted

## 2019-12-01 NOTE — Telephone Encounter (Signed)
Schedule AWV.  

## 2019-12-07 ENCOUNTER — Ambulatory Visit: Payer: Medicare HMO | Admitting: Registered Nurse

## 2019-12-07 VITALS — BP 140/82 | Ht 67.0 in | Wt 327.0 lb

## 2019-12-07 DIAGNOSIS — Z0000 Encounter for general adult medical examination without abnormal findings: Secondary | ICD-10-CM

## 2019-12-07 NOTE — Patient Instructions (Addendum)
Thank you for taking time to come for your Medicare Wellness Visit. I appreciate your ongoing commitment to your health goals. Please review the following plan we discussed and let me know if I can assist you in the future.  Mariah Kennedy LPN  Preventive Care 54-62 Years Old (64 Years Old), Female Preventive care refers to visits with your health care provider and lifestyle choices that can promote health and wellness. This includes:  A yearly physical exam. This may also be called an annual well check.  Regular dental visits and eye exams.  Immunizations.  Screening for certain conditions.  Healthy lifestyle choices, such as eating a healthy diet, getting regular exercise, not using drugs or products that contain nicotine and tobacco, and limiting alcohol use. What can I expect for my preventive care visit? Physical exam Your health care provider will check your:  Height and weight. This may be used to calculate body mass index (BMI), which tells if you are at a healthy weight.  Heart rate and blood pressure.  Skin for abnormal spots. Counseling Your health care provider may ask you questions about your:  Alcohol, tobacco, and drug use.  Emotional well-being.  Home and relationship well-being.  Sexual activity.  Eating habits.  Work and work Statistician.  Method of birth control.  Menstrual cycle.  Pregnancy history. What immunizations do I need?  Influenza (flu) vaccine  This is recommended every year. Tetanus, diphtheria, and pertussis (Tdap) vaccine  You may need a Td booster every 10 years. Varicella (chickenpox) vaccine  You may need this if you have not been vaccinated. Zoster (shingles) vaccine  You may need this after age 25. Measles, mumps, and rubella (MMR) vaccine  You may need at least one dose of MMR if you were born in 1957 or later. You may also need a second dose. Pneumococcal conjugate (PCV13) vaccine  You may need this if you have certain conditions  and were not previously vaccinated. Pneumococcal polysaccharide (PPSV23) vaccine  You may need one or two doses if you smoke cigarettes or if you have certain conditions. Meningococcal conjugate (MenACWY) vaccine  You may need this if you have certain conditions. Hepatitis A vaccine  You may need this if you have certain conditions or if you travel or work in places where you may be exposed to hepatitis A. Hepatitis B vaccine  You may need this if you have certain conditions or if you travel or work in places where you may be exposed to hepatitis B. Haemophilus influenzae type b (Hib) vaccine  You may need this if you have certain conditions. Human papillomavirus (HPV) vaccine  If recommended by your health care provider, you may need three doses over 6 months. You may receive vaccines as individual doses or as more than one vaccine together in one shot (combination vaccines). Talk with your health care provider about the risks and benefits of combination vaccines. What tests do I need? Blood tests  Lipid and cholesterol levels. These may be checked every 5 years, or more frequently if you are over 62 years old.  Hepatitis C test.  Hepatitis B test. Screening  Lung cancer screening. You may have this screening every year starting at age 8 if you have a 30-pack-year history of smoking and currently smoke or have quit within the past 15 years.  Colorectal cancer screening. All adults should have this screening starting at age 103 and continuing until age 42. Your health care provider may recommend screening at age 57 if you are  at increased risk. You will have tests every 1-10 years, depending on your results and the type of screening test.  Diabetes screening. This is done by checking your blood sugar (glucose) after you have not eaten for a while (fasting). You may have this done every 1-3 years.  Mammogram. This may be done every 1-2 years. Talk with your health care provider  about when you should start having regular mammograms. This may depend on whether you have a family history of breast cancer.  BRCA-related cancer screening. This may be done if you have a family history of breast, ovarian, tubal, or peritoneal cancers.  Pelvic exam and Pap test. This may be done every 3 years starting at age 28. Starting at age 45, this may be done every 5 years if you have a Pap test in combination with an HPV test. Other tests  Sexually transmitted disease (STD) testing.  Bone density scan. This is done to screen for osteoporosis. You may have this scan if you are at high risk for osteoporosis. Follow these instructions at home: Eating and drinking  Eat a diet that includes fresh fruits and vegetables, whole grains, lean protein, and low-fat dairy.  Take vitamin and mineral supplements as recommended by your health care provider.  Do not drink alcohol if: ? Your health care provider tells you not to drink. ? You are pregnant, may be pregnant, or are planning to become pregnant.  If you drink alcohol: ? Limit how much you have to 0-1 drink a day. ? Be aware of how much alcohol is in your drink. In the [place] one drink equals one 12 oz bottle of beer (355 mL), one 5 oz glass of wine (148 mL), or one 1 oz glass of hard liquor (44 mL). Lifestyle  Take daily care of your teeth and gums.  Stay active. Exercise for at least 30 minutes on 5 or more days each week.  Do not use any products that contain nicotine or tobacco, such as cigarettes, e-cigarettes, and chewing tobacco. If you need help quitting, ask your health care provider.  If you are sexually active, practice safe sex. Use a condom or other form of birth control (contraception) in order to prevent pregnancy and STIs (sexually transmitted infections).  If told by your health care provider, take low-dose aspirin daily starting at age 67. What's next?  Visit your health care provider once a year for a well  check visit.  Ask your health care provider how often you should have your eyes and teeth checked.  Stay up to date on all vaccines. This information is not intended to replace advice given to you by your health care provider. Make sure you discuss any questions you have with your health care provider. Document Revised: 07/08/2018 Document Reviewed: 07/08/2018 Elsevier Patient Education  2020 [employer].

## 2019-12-07 NOTE — Progress Notes (Signed)
Presents today for [employer] Visit   Date of last exam: 09/20/2019  Interpreter used for this visit? No  I connected with  Mariah [name] on 12/07/19 by a telephone  and verified that I am speaking with the correct person using two identifiers.   I discussed the limitations of evaluation and management by telemedicine. The patient expressed understanding and agreed to proceed.    Patient Care Team: Maximiano Coss, NP as PCP - General (Adult Health Nurse Practitioner)   Other items to address today:   Discussed immunizations Discussed Eye/Dental    Other Screening: Last screening for diabetes: 06/20/2019 Last lipid screening: 06/20/2019  ADVANCE DIRECTIVES: Discussed: YES On File: NO Materials Provided: YES  Immunization status:  Immunization History  Administered Date(s) Administered  . Influenza,inj,Quad PF,6+ Mos 09/20/2019     Health Maintenance Due  Topic Date Due  . Hepatitis C Screening  1956/05/22  . HIV Screening  11/21/1972  . TETANUS/TDAP  11/21/1976     Functional Status Survey: Is the patient deaf or have difficulty hearing?: No Does the patient have difficulty seeing, even when wearing glasses/contacts?: No Does the patient have difficulty concentrating, remembering, or making decisions?: No Does the patient have difficulty walking or climbing stairs?: No Does the patient have difficulty dressing or bathing?: No Does the patient have difficulty doing errands alone such as visiting a doctor's office or shopping?: No   6CIT Screen 12/07/2019  What Year? 0 points  What month? 0 points  What time? 0 points  Count back from 20 0 points  Months in reverse 0 points  Repeat phrase 0 points  Total Score 0        Clinical Support from 12/07/2019 in [facility] [place]  AUDIT-C Score  4       Home Environment:   Lives in one story home  Some trouble climbing stairs when arthritis acts up. No scattered rugs No grab  bars Adequate lighting/no clutter  Timed warm up N/A    Patient Active Problem List   Diagnosis Date Noted  . Arthritis 08/29/2019  . Obesity 08/29/2019  . Hypertensive disorder 06/20/2019     Past Medical History:  Diagnosis Date  . Arthritis    oa  . Headache   . Hypertension   . Shortness of breath dyspnea    with exertion     Past Surgical History:  Procedure Laterality Date  . COLONOSCOPY WITH PROPOFOL N/A 03/12/2016   Procedure: COLONOSCOPY WITH PROPOFOL;  Surgeon: Arta Silence, MD;  Location: [facility];  Service: Endoscopy;  Laterality: N/A;  . NO PAST SURGERIES       History reviewed. No pertinent family history.   Social History   Socioeconomic History  . Marital status: Widowed    Spouse name: Not on file  . Number of children: Not on file  . Years of education: Not on file  . Highest education level: Not on file  Occupational History  . Not on file  Tobacco Use  . Smoking status: Never Smoker  . Smokeless tobacco: Never Used  Substance and Sexual Activity  . Alcohol use: Yes    Comment: occc wine  . Drug use: No  . Sexual activity: Not on file  Other Topics Concern  . Not on file  Social History Narrative  . Not on file   Social Determinants of Health   Financial Resource Strain:   . Difficulty of Paying Living Expenses: Not on file  Food Insecurity:   . Worried About Programme researcher, broadcasting/film/video in the Last Year: Not on file  . Ran Out of Food in the Last Year: Not on file  Transportation Needs:   . Lack of Transportation (Medical): Not on file  . Lack of Transportation (Non-Medical): Not on file  Physical Activity:   . Days of Exercise per Week: Not on file  . Minutes of Exercise per Session: Not on file  Stress:   . Feeling of Stress : Not on file  Social Connections:   . Frequency of Communication with Friends and Family: Not on file  . Frequency of Social Gatherings with Friends and Family: Not on file  . Attends Religious  Services: Not on file  . Active Member of Clubs or Organizations: Not on file  . Attends Banker Meetings: Not on file  . Marital Status: Not on file  Intimate Partner Violence:   . Fear of Current or Ex-Partner: Not on file  . Emotionally Abused: Not on file  . Physically Abused: Not on file  . Sexually Abused: Not on file     Allergies  Allergen Reactions  . Penicillins Shortness Of Breath, Swelling and Other (See Comments)    Has patient had a PCN reaction causing immediate rash, facial/tongue/throat swelling, SOB or lightheadedness with hypotension: yes Has patient had a PCN reaction causing severe rash involving mucus membranes or skin necrosis: no Has patient had a PCN reaction that required hospitalization no Has patient had a PCN reaction occurring within the last 10 years: no If all of the above answers are "NO", then may proceed with Cephalosporin use.      Prior to Admission medications   Medication Sig Start Date End Date Taking? Authorizing Provider  CALCIUM CARBONATE PO Take 1 tablet by mouth daily.   Yes [provider]  chlorthalidone (HYGROTON) 25 MG tablet Take 1 tablet (25 mg total) by mouth daily. 06/20/19  Yes Janeece Agee, NP  Cholecalciferol (VITAMIN D3 PO) Take 1 tablet by mouth daily.   Yes [provider]  metoprolol succinate (TOPROL-XL) 100 MG 24 hr tablet TAKE 1 TABLET EVERY DAY 11/14/19  Yes Janeece Agee, NP  Multiple Vitamin (MULTIVITAMIN WITH MINERALS) TABS tablet Take 1 tablet by mouth daily.   Yes [provider]  valsartan (DIOVAN) 320 MG tablet Take 1 tablet (320 mg total) by mouth daily. 09/20/19  Yes Janeece Agee, NP  hydrochlorothiazide (HYDRODIURIL) 25 MG tablet Take 1 tablet (25 mg total) by mouth daily. 09/20/19   Janeece Agee, NP     Depression screen [facility] 2/9 12/07/2019 09/20/2019 06/20/2019 07/02/2015  Decreased Interest 0 0 0 0  Down, Depressed, Hopeless 0 0 0 0  PHQ - 2 Score 0 0 0 0      Fall Risk  12/07/2019 09/20/2019 06/20/2019 07/02/2015  Falls in the past year? 0 0 0 No  Number falls in past yr: 0 0 0 -  Injury with Fall? 0 0 0 -  Follow up Falls evaluation completed;Education provided - - -      PHYSICAL EXAM: BP 140/82 Comment: taken from a previous visit  Ht 5\' 7"  (1.702 m)   Wt (!) 327 lb (148.3 kg)   BMI 51.22 kg/m    Wt Readings from Last 3 Encounters:  12/07/19 (!) 327 lb (148.3 kg)  09/20/19 (!) 327 lb (148.3 kg)  06/20/19 (!) 324 lb (147 kg)   Medicare annual wellness visit, subsequent  Education/Counseling provided regarding diet and exercise, prevention of chronic diseases, smoking/tobacco cessation, if applicable, and reviewed "Covered Medicare Preventive Services."

## 2019-12-09 ENCOUNTER — Encounter: Payer: Self-pay | Admitting: Registered Nurse

## 2019-12-27 ENCOUNTER — Other Ambulatory Visit: Payer: Self-pay | Admitting: Registered Nurse

## 2019-12-27 ENCOUNTER — Encounter: Payer: Self-pay | Admitting: Registered Nurse

## 2019-12-27 DIAGNOSIS — I10 Essential (primary) hypertension: Secondary | ICD-10-CM

## 2019-12-27 MED ORDER — CHLORTHALIDONE 25 MG PO TABS: 25.0000 mg | ORAL_TABLET | Freq: Every day | ORAL | 1 refills | Status: DC

## 2019-12-27 NOTE — Telephone Encounter (Signed)
Patient has the incorrect prescription refilled and almost out of medication.  Please Advise.

## 2019-12-27 NOTE — Telephone Encounter (Signed)
Please Advise

## 2019-12-28 NOTE — Telephone Encounter (Signed)
Spoke with patient she didn't need the 7 day supply was just okay that the right prescription was sent.

## 2020-01-09 ENCOUNTER — Encounter: Payer: Self-pay | Admitting: Registered Nurse

## 2020-02-14 ENCOUNTER — Encounter: Payer: Self-pay | Admitting: Registered Nurse

## 2020-02-14 NOTE — Telephone Encounter (Signed)
Attempted to call the patient Mariah [name] for the patient. Told the patient to return the call and set up an appointment for the foot pain because she has not been seen for that before here.

## 2020-02-17 ENCOUNTER — Encounter: Payer: Self-pay | Admitting: Registered Nurse

## 2020-02-17 NOTE — Telephone Encounter (Signed)
Called and spoke with patient and told her it was a 1:30 appointment she stated she would keep the one she currently have.

## 2020-02-22 ENCOUNTER — Ambulatory Visit (INDEPENDENT_AMBULATORY_CARE_PROVIDER_SITE_OTHER): Payer: Medicare HMO | Admitting: Registered Nurse

## 2020-02-22 ENCOUNTER — Other Ambulatory Visit: Payer: Self-pay

## 2020-02-22 ENCOUNTER — Encounter: Payer: Self-pay | Admitting: Registered Nurse

## 2020-02-22 VITALS — BP 150/90 | HR 64 | Temp 98.0°F | Resp 18 | Ht 67.0 in | Wt 335.6 lb

## 2020-02-22 DIAGNOSIS — M25572 Pain in left ankle and joints of left foot: Secondary | ICD-10-CM

## 2020-02-22 MED ORDER — MELOXICAM 7.5 MG PO TABS: 7.5000 mg | ORAL_TABLET | Freq: Every day | ORAL | 0 refills | Status: DC

## 2020-02-22 NOTE — Progress Notes (Signed)
Acute Office Visit  Subjective:    Patient ID: Mariah [name], female    DOB: 06-Sep-1956, 64 y.o.   MRN: [id number]  Chief Complaint  Patient presents with  . Ankle Pain    patient states she has been having some left ankle pain for about two weeks . pain goes away when she elevate her leg but when she startes walking it comes right back. Per patient she has been using tylenol for pain     HPI Patient is in today for L ankle pain. Ongoing for about 2 weeks. Waxing and waning. Slow onset. Located on anterior aspect of joint, tends towards lateral. No involvement of malleoli. No achilles tenderness or pain. No calf pain. No pain on sole of foot.  Notes that she has arthritis in both knees - this causes pain but right now it is not more than she usually experiences. No falls recently. Has been taking tylenol with some relief No swelling, warmth, redness, no hx of dvt.  Hx of R ankle fx, has been chronically swollen since  Otherwise no complaints  Past Medical History:  Diagnosis Date  . Arthritis    oa  . Headache   . Hypertension   . Shortness of breath dyspnea    with exertion    Past Surgical History:  Procedure Laterality Date  . COLONOSCOPY WITH PROPOFOL N/A 03/12/2016   Procedure: COLONOSCOPY WITH PROPOFOL;  Surgeon: [name], MD;  Location: [facility];  Service: Endoscopy;  Laterality: N/A;  . NO PAST SURGERIES      No family history on file.  Social History   Socioeconomic History  . Marital status: Widowed    Spouse name: Not on file  . Number of children: Not on file  . Years of education: Not on file  . Highest education level: Not on file  Occupational History  . Not on file  Tobacco Use  . Smoking status: Never Smoker  . Smokeless tobacco: Never Used  Substance and Sexual Activity  . Alcohol use: Yes    Comment: occc wine  . Drug use: No  . Sexual activity: Not on file  Other Topics Concern  . Not on file  Social History Narrative  . Not  on file   Social Determinants of Health   Financial Resource Strain:   . Difficulty of Paying Living Expenses:   Food Insecurity:   . Worried About Programme researcher, broadcasting/film/video in the Last Year:   . Barista in the Last Year:   Transportation Needs:   . Freight forwarder (Medical):   Marland Kitchen Lack of Transportation (Non-Medical):   Physical Activity:   . Days of Exercise per Week:   . Minutes of Exercise per Session:   Stress:   . Feeling of Stress :   Social Connections:   . Frequency of Communication with Friends and Family:   . Frequency of Social Gatherings with Friends and Family:   . Attends Religious Services:   . Active Member of Clubs or Organizations:   . Attends Banker Meetings:   Marland Kitchen Marital Status:   Intimate Partner Violence:   . Fear of Current or Ex-Partner:   . Emotionally Abused:   Marland Kitchen Physically Abused:   . Sexually Abused:     Outpatient Medications Prior to Visit  Medication Sig Dispense Refill  . CALCIUM CARBONATE PO Take 1 tablet by mouth daily.    . chlorthalidone (HYGROTON) 25 MG tablet Take 1 tablet (  25 mg total) by mouth daily. 90 tablet 1  . Cholecalciferol (VITAMIN D3 PO) Take 1 tablet by mouth daily.    . metoprolol succinate (TOPROL-XL) 100 MG 24 hr tablet TAKE 1 TABLET EVERY DAY 90 tablet 1  . Multiple Vitamin (MULTIVITAMIN WITH MINERALS) TABS tablet Take 1 tablet by mouth daily.    . valsartan (DIOVAN) 320 MG tablet Take 1 tablet (320 mg total) by mouth daily. 90 tablet 3   No facility-administered medications prior to visit.    Allergies  Allergen Reactions  . Penicillins Shortness Of Breath, Swelling and Other (See Comments)    Has patient had a PCN reaction causing immediate rash, facial/tongue/throat swelling, SOB or lightheadedness with hypotension: yes Has patient had a PCN reaction causing severe rash involving mucus membranes or skin necrosis: no Has patient had a PCN reaction that required hospitalization no Has patient  had a PCN reaction occurring within the last 10 years: no If all of the above answers are "NO", then may proceed with Cephalosporin use.     Review of Systems Per hpi      Objective:    Physical Exam Vitals and nursing note reviewed.  Constitutional:      General: She is not in acute distress.    Appearance: Normal appearance. She is obese. She is not ill-appearing, toxic-appearing or diaphoretic.  Cardiovascular:     Rate and Rhythm: Normal rate and regular rhythm.     Pulses: Normal pulses.  Pulmonary:     Effort: Pulmonary effort is normal. No respiratory distress.  Musculoskeletal:        General: Tenderness (mild, anterior l ankle) present. No swelling, deformity or signs of injury. Normal range of motion.     Right lower leg: No edema.     Left lower leg: No edema.  Skin:    General: Skin is warm and dry.     Capillary Refill: Capillary refill takes less than 2 seconds.     Coloration: Skin is not jaundiced or pale.     Findings: No bruising, erythema, lesion or rash.  Neurological:     General: No focal deficit present.     Mental Status: She is alert and oriented to person, place, and time. Mental status is at baseline.     Cranial Nerves: No cranial nerve deficit.     Sensory: No sensory deficit.     Motor: No weakness.  Psychiatric:        Mood and Affect: Mood normal.        Behavior: Behavior normal.        Thought Content: Thought content normal.        Judgment: Judgment normal.     BP (!) 150/90   Pulse 64   Temp 98 F (36.7 C) (Temporal)   Resp 18   Ht 5\' 7"  (1.702 m)   Wt (!) 335 lb 9.6 oz (152.2 kg)   SpO2 100%   BMI 52.56 kg/m  Wt Readings from Last 3 Encounters:  02/22/20 (!) 335 lb 9.6 oz (152.2 kg)  12/07/19 (!) 327 lb (148.3 kg)  09/20/19 (!) 327 lb (148.3 kg)    Health Maintenance Due  Topic Date Due  . Hepatitis C Screening  Never done    There are no preventive care reminders to display for this patient.   Lab Results   Component Value Date   TSH 1.170 06/20/2019   Lab Results  Component Value Date   WBC 5.9 06/20/2019  HGB 12.1 06/20/2019   HCT 35.4 06/20/2019   MCV 83 06/20/2019   PLT 298 06/20/2019   Lab Results  Component Value Date   NA 141 06/20/2019   K 3.3 (L) 06/20/2019   CO2 24 06/20/2019   GLUCOSE 111 (H) 06/20/2019   BUN 13 06/20/2019   CREATININE 0.85 06/20/2019   BILITOT 0.3 06/20/2019   ALKPHOS 58 06/20/2019   AST 27 06/20/2019   ALT 20 06/20/2019   PROT 7.1 06/20/2019   ALBUMIN 4.0 06/20/2019   CALCIUM 9.8 06/20/2019   Lab Results  Component Value Date   CHOL 189 06/20/2019   Lab Results  Component Value Date   HDL 56 06/20/2019   Lab Results  Component Value Date   LDLCALC 110 (H) 06/20/2019   Lab Results  Component Value Date   TRIG 117 06/20/2019   Lab Results  Component Value Date   CHOLHDL 3.4 06/20/2019   Lab Results  Component Value Date   HGBA1C 5.7 (H) 06/20/2019       Assessment & Plan:   Problem List Items Addressed This Visit    None    Visit Diagnoses    Acute left ankle pain    -  Primary   Relevant Medications   meloxicam (MOBIC) 7.5 MG tablet   Other Relevant Orders   Ambulatory referral to Physical Therapy       Meds ordered this encounter  Medications  . DISCONTD: meloxicam (MOBIC) 7.5 MG tablet    Sig: Take 1 tablet (7.5 mg total) by mouth daily.    Dispense:  30 tablet    Refill:  0    Order Specific Question:   Supervising Provider    Answer:   Collie Siad A [id number]  . meloxicam (MOBIC) 7.5 MG tablet    Sig: Take 1 tablet (7.5 mg total) by mouth daily.    Dispense:  30 tablet    Refill:  0    Order Specific Question:   Supervising Provider    Answer:   Doristine Bosworth [id number]   PLAN  Not likely fx or dvt  Will give meloxicam, suggest rice, sent to PT  Given reasons to return to clinic, pt understands  Patient encouraged to call clinic with any questions, comments, or concerns.   Janeece Agee, NP

## 2020-02-22 NOTE — Patient Instructions (Signed)
° ° ° °  If you have lab work done today you will be contacted with your lab results within the next 2 weeks.  If you have not heard from us then please contact us. The fastest way to get your results is to register for My Chart. ° ° °IF you received an x-ray today, you will receive an invoice from [facility]. Please contact [facility] at [phone] with questions or concerns regarding your invoice.  ° °IF you received labwork today, you will receive an invoice from [facility]. Please contact [facility] at [phone] with questions or concerns regarding your invoice.  ° °Our billing staff will not be able to assist you with questions regarding bills from these companies. ° °You will be contacted with the lab results as soon as they are available. The fastest way to get your results is to activate your My Chart account. Instructions are located on the last page of this paperwork. If you have not heard from us regarding the results in 2 weeks, please contact this office. °  ° ° ° °

## 2020-03-16 ENCOUNTER — Other Ambulatory Visit: Payer: Self-pay | Admitting: Registered Nurse

## 2020-03-16 DIAGNOSIS — M25572 Pain in left ankle and joints of left foot: Secondary | ICD-10-CM

## 2020-05-10 ENCOUNTER — Other Ambulatory Visit: Payer: Self-pay | Admitting: Registered Nurse

## 2020-05-10 DIAGNOSIS — I10 Essential (primary) hypertension: Secondary | ICD-10-CM

## 2020-05-18 ENCOUNTER — Other Ambulatory Visit: Payer: Self-pay | Admitting: Registered Nurse

## 2020-05-18 DIAGNOSIS — I10 Essential (primary) hypertension: Secondary | ICD-10-CM

## 2020-07-24 ENCOUNTER — Telehealth: Payer: Self-pay | Admitting: Registered Nurse

## 2020-07-24 NOTE — Telephone Encounter (Signed)
Referral Followup °

## 2020-08-29 DIAGNOSIS — Z1231 Encounter for screening mammogram for malignant neoplasm of breast: Secondary | ICD-10-CM | POA: Diagnosis not present

## 2020-08-29 DIAGNOSIS — N958 Other specified menopausal and perimenopausal disorders: Secondary | ICD-10-CM | POA: Diagnosis not present

## 2020-08-29 DIAGNOSIS — Z01419 Encounter for gynecological examination (general) (routine) without abnormal findings: Secondary | ICD-10-CM | POA: Diagnosis not present

## 2020-08-29 DIAGNOSIS — Z6841 Body Mass Index (BMI) 40.0 and over, adult: Secondary | ICD-10-CM | POA: Diagnosis not present

## 2020-09-07 DIAGNOSIS — H524 Presbyopia: Secondary | ICD-10-CM | POA: Diagnosis not present

## 2020-09-27 ENCOUNTER — Other Ambulatory Visit: Payer: Self-pay | Admitting: Registered Nurse

## 2020-09-27 DIAGNOSIS — I10 Essential (primary) hypertension: Secondary | ICD-10-CM

## 2020-09-27 NOTE — Telephone Encounter (Signed)
No further refills without office visit 

## 2020-09-27 NOTE — Telephone Encounter (Signed)
Requested medications are due for refill today yes  Requested medications are on the active medication list yes  Last refill 9/7  Last visit 09/2019  Future visit scheduled no  Notes to clinic Failed protocol of visit within 6 months, no upcoming visit scheduled.

## 2020-10-01 ENCOUNTER — Other Ambulatory Visit: Payer: Self-pay | Admitting: Registered Nurse

## 2020-10-01 DIAGNOSIS — I10 Essential (primary) hypertension: Secondary | ICD-10-CM

## 2020-10-02 ENCOUNTER — Other Ambulatory Visit: Payer: Self-pay | Admitting: Registered Nurse

## 2020-10-02 DIAGNOSIS — I10 Essential (primary) hypertension: Secondary | ICD-10-CM

## 2020-10-02 NOTE — Telephone Encounter (Signed)
Pt need meds, however labs are needed. Pls call for appt

## 2020-10-03 ENCOUNTER — Encounter: Payer: Self-pay | Admitting: Registered Nurse

## 2020-10-03 ENCOUNTER — Other Ambulatory Visit: Payer: Self-pay | Admitting: Registered Nurse

## 2020-10-03 DIAGNOSIS — I10 Essential (primary) hypertension: Secondary | ICD-10-CM

## 2020-10-03 MED ORDER — METOPROLOL SUCCINATE ER 100 MG PO TB24: 100.0000 mg | ORAL_TABLET | Freq: Every day | ORAL | 1 refills | Status: DC

## 2020-10-03 MED ORDER — VALSARTAN 320 MG PO TABS: 320.0000 mg | ORAL_TABLET | Freq: Every day | ORAL | 0 refills | Status: DC

## 2020-10-03 NOTE — Telephone Encounter (Signed)
LVMTCB to sch appt for med refill.

## 2020-10-14 ENCOUNTER — Other Ambulatory Visit: Payer: Self-pay | Admitting: Registered Nurse

## 2020-10-14 DIAGNOSIS — I10 Essential (primary) hypertension: Secondary | ICD-10-CM

## 2020-10-15 ENCOUNTER — Ambulatory Visit (INDEPENDENT_AMBULATORY_CARE_PROVIDER_SITE_OTHER): Payer: Medicare HMO | Admitting: Registered Nurse

## 2020-10-15 ENCOUNTER — Other Ambulatory Visit: Payer: Self-pay

## 2020-10-15 ENCOUNTER — Encounter: Payer: Self-pay | Admitting: Registered Nurse

## 2020-10-15 VITALS — BP 158/85 | HR 67 | Temp 97.6°F | Ht 67.0 in | Wt 325.4 lb

## 2020-10-15 DIAGNOSIS — Z13220 Encounter for screening for lipoid disorders: Secondary | ICD-10-CM | POA: Diagnosis not present

## 2020-10-15 DIAGNOSIS — Z1329 Encounter for screening for other suspected endocrine disorder: Secondary | ICD-10-CM | POA: Diagnosis not present

## 2020-10-15 DIAGNOSIS — I10 Essential (primary) hypertension: Secondary | ICD-10-CM

## 2020-10-15 DIAGNOSIS — M7989 Other specified soft tissue disorders: Secondary | ICD-10-CM

## 2020-10-15 DIAGNOSIS — Z13228 Encounter for screening for other metabolic disorders: Secondary | ICD-10-CM | POA: Diagnosis not present

## 2020-10-15 DIAGNOSIS — Z130 Encounter for screening for diseases of the blood and blood-forming organs and certain disorders involving the immune mechanism: Secondary | ICD-10-CM

## 2020-10-15 MED ORDER — VALSARTAN 320 MG PO TABS: 320.0000 mg | ORAL_TABLET | Freq: Every day | ORAL | 1 refills | Status: DC

## 2020-10-15 MED ORDER — METOPROLOL SUCCINATE ER 100 MG PO TB24: 100.0000 mg | ORAL_TABLET | Freq: Every day | ORAL | 1 refills | Status: DC

## 2020-10-15 MED ORDER — CHLORTHALIDONE 50 MG PO TABS: 50.0000 mg | ORAL_TABLET | Freq: Every day | ORAL | 1 refills | Status: DC

## 2020-10-15 NOTE — Progress Notes (Signed)
Established Patient Office Visit  Subjective:  Patient ID: Mariah [name], female    DOB: Oct 05, 1956  Age: 64 y.o. MRN: [id number]  CC:  Chief Complaint  Patient presents with  . right ankle swelling    x3 days  . Medication Refill    HPI [name] presents for med refill  Needs bp meds Has not yet run out yet No chest pain, palpitations, headaches, visual changes, or claudication Does have some R leg swelling Has happened on and off in the past, has seen vascular, states compression stockings are not effective unfortunately No redness or acute pain No changes to diet and exercise Does state that she's hoping to lose weight.  Past Medical History:  Diagnosis Date  . Arthritis    oa  . Headache   . Hypertension   . Shortness of breath dyspnea    with exertion    Past Surgical History:  Procedure Laterality Date  . COLONOSCOPY WITH PROPOFOL N/A 03/12/2016   Procedure: COLONOSCOPY WITH PROPOFOL;  Surgeon: [name], MD;  Location: [facility];  Service: Endoscopy;  Laterality: N/A;  . NO PAST SURGERIES      No family history on file.  Social History   Socioeconomic History  . Marital status: Widowed    Spouse name: Not on file  . Number of children: Not on file  . Years of education: Not on file  . Highest education level: Not on file  Occupational History  . Not on file  Tobacco Use  . Smoking status: Never Smoker  . Smokeless tobacco: Never Used  Substance and Sexual Activity  . Alcohol use: Yes    Comment: occc wine  . Drug use: No  . Sexual activity: Not on file  Other Topics Concern  . Not on file  Social History Narrative  . Not on file   Social Determinants of Health   Financial Resource Strain:   . Difficulty of Paying Living Expenses: Not on file  Food Insecurity:   . Worried About Programme researcher, broadcasting/film/video in the Last Year: Not on file  . Ran Out of Food in the Last Year: Not on file  Transportation Needs:   . Lack of Transportation  (Medical): Not on file  . Lack of Transportation (Non-Medical): Not on file  Physical Activity:   . Days of Exercise per Week: Not on file  . Minutes of Exercise per Session: Not on file  Stress:   . Feeling of Stress : Not on file  Social Connections:   . Frequency of Communication with Friends and Family: Not on file  . Frequency of Social Gatherings with Friends and Family: Not on file  . Attends Religious Services: Not on file  . Active Member of Clubs or Organizations: Not on file  . Attends Banker Meetings: Not on file  . Marital Status: Not on file  Intimate Partner Violence:   . Fear of Current or Ex-Partner: Not on file  . Emotionally Abused: Not on file  . Physically Abused: Not on file  . Sexually Abused: Not on file    Outpatient Medications Prior to Visit  Medication Sig Dispense Refill  . CALCIUM CARBONATE PO Take 1 tablet by mouth daily.    . chlorthalidone (HYGROTON) 25 MG tablet TAKE 1 TABLET EVERY DAY 90 tablet 1  . Cholecalciferol (VITAMIN D3 PO) Take 1 tablet by mouth daily.    . Multiple Vitamin (MULTIVITAMIN WITH MINERALS) TABS tablet Take 1 tablet by  mouth daily.    . metoprolol succinate (TOPROL-XL) 100 MG 24 hr tablet Take 1 tablet (100 mg total) by mouth daily. Take with or immediately following a meal. 90 tablet 1  . valsartan (DIOVAN) 320 MG tablet Take 1 tablet (320 mg total) by mouth daily. 30 tablet 0  . meloxicam (MOBIC) 7.5 MG tablet TAKE 1 TABLET BY MOUTH EVERY DAY 30 tablet 0   No facility-administered medications prior to visit.    Allergies  Allergen Reactions  . Penicillins Shortness Of Breath, Swelling and Other (See Comments)    Has patient had a PCN reaction causing immediate rash, facial/tongue/throat swelling, SOB or lightheadedness with hypotension: yes Has patient had a PCN reaction causing severe rash involving mucus membranes or skin necrosis: no Has patient had a PCN reaction that required hospitalization no Has  patient had a PCN reaction occurring within the last 10 years: no If all of the above answers are "NO", then may proceed with Cephalosporin use.     ROS Review of Systems  Constitutional: Negative.   HENT: Negative.   Eyes: Negative.   Respiratory: Negative.   Cardiovascular: Positive for leg swelling (r leg). Negative for chest pain and palpitations.  Gastrointestinal: Negative.   Genitourinary: Negative.   Musculoskeletal: Negative.   Skin: Negative.   Neurological: Negative.   Psychiatric/Behavioral: Negative.   All other systems reviewed and are negative.     Objective:    Physical Exam Vitals and nursing note reviewed.  Constitutional:      General: She is not in acute distress.    Appearance: Normal appearance. She is normal weight. She is not ill-appearing, toxic-appearing or diaphoretic.  Cardiovascular:     Rate and Rhythm: Normal rate and regular rhythm.     Heart sounds: Normal heart sounds. No murmur heard.  No friction rub. No gallop.   Pulmonary:     Effort: Pulmonary effort is normal. No respiratory distress.     Breath sounds: Normal breath sounds. No stridor. No wheezing, rhonchi or rales.  Chest:     Chest wall: No tenderness.  Musculoskeletal:        General: No swelling, tenderness, deformity or signs of injury.     Right lower leg: Edema (+2 pitting) present.     Left lower leg: No edema.  Skin:    General: Skin is warm and dry.     Capillary Refill: Capillary refill takes less than 2 seconds.  Neurological:     General: No focal deficit present.     Mental Status: She is alert and oriented to person, place, and time. Mental status is at baseline.  Psychiatric:        Mood and Affect: Mood normal.        Behavior: Behavior normal.        Thought Content: Thought content normal.        Judgment: Judgment normal.     BP (!) 158/85   Pulse 67   Temp 97.6 F (36.4 C) (Temporal)   Ht 5\' 7"  (1.702 m)   Wt (!) 325 lb 6.4 oz (147.6 kg)   SpO2  97%   BMI 50.96 kg/m  Wt Readings from Last 3 Encounters:  10/15/20 (!) 325 lb 6.4 oz (147.6 kg)  02/22/20 (!) 335 lb 9.6 oz (152.2 kg)  12/07/19 (!) 327 lb (148.3 kg)     Health Maintenance Due  Topic Date Due  . Hepatitis C Screening  Never done  . COVID-19 Vaccine (1)  Never done  . INFLUENZA VACCINE  06/10/2020    There are no preventive care reminders to display for this patient.  Lab Results  Component Value Date   TSH 1.170 06/20/2019   Lab Results  Component Value Date   WBC 5.9 06/20/2019   HGB 12.1 06/20/2019   HCT 35.4 06/20/2019   MCV 83 06/20/2019   PLT 298 06/20/2019   Lab Results  Component Value Date   NA 141 06/20/2019   K 3.3 (L) 06/20/2019   CO2 24 06/20/2019   GLUCOSE 111 (H) 06/20/2019   BUN 13 06/20/2019   CREATININE 0.85 06/20/2019   BILITOT 0.3 06/20/2019   ALKPHOS 58 06/20/2019   AST 27 06/20/2019   ALT 20 06/20/2019   PROT 7.1 06/20/2019   ALBUMIN 4.0 06/20/2019   CALCIUM 9.8 06/20/2019   Lab Results  Component Value Date   CHOL 189 06/20/2019   Lab Results  Component Value Date   HDL 56 06/20/2019   Lab Results  Component Value Date   LDLCALC 110 (H) 06/20/2019   Lab Results  Component Value Date   TRIG 117 06/20/2019   Lab Results  Component Value Date   CHOLHDL 3.4 06/20/2019   Lab Results  Component Value Date   HGBA1C 5.7 (H) 06/20/2019      Assessment & Plan:   Problem List Items Addressed This Visit    None    Visit Diagnoses    Screening for endocrine, metabolic and immunity disorder    -  Primary   Relevant Orders   CBC With Differential   Hemoglobin A1c   TSH   Comprehensive metabolic panel   Essential hypertension       Relevant Medications   metoprolol succinate (TOPROL-XL) 100 MG 24 hr tablet   valsartan (DIOVAN) 320 MG tablet   chlorthalidone (HYGROTON) 50 MG tablet   Other Relevant Orders   CBC With Differential   Hemoglobin A1c   Lipid panel   TSH   Comprehensive metabolic panel    Lipid screening       Relevant Orders   Lipid panel      Meds ordered this encounter  Medications  . metoprolol succinate (TOPROL-XL) 100 MG 24 hr tablet    Sig: Take 1 tablet (100 mg total) by mouth daily. Take with or immediately following a meal.    Dispense:  90 tablet    Refill:  1  . valsartan (DIOVAN) 320 MG tablet    Sig: Take 1 tablet (320 mg total) by mouth daily.    Dispense:  90 tablet    Refill:  1  . chlorthalidone (HYGROTON) 50 MG tablet    Sig: Take 1 tablet (50 mg total) by mouth daily.    Dispense:  90 tablet    Refill:  1    Order Specific Question:   Supervising Provider    Answer:   Neva Seat, JEFFREY R [[id number]]    Follow-up: No follow-ups on file.   PLAN  Refill meds  Increase chlorthalidone to 50mg  PO qd  Sent for to rule out dvt  Labs collected. Will follow up with the patient as warranted.  Patient encouraged to call clinic with any questions, comments, or concerns.  [place], NP

## 2020-10-16 ENCOUNTER — Encounter: Payer: Self-pay | Admitting: Registered Nurse

## 2020-10-16 LAB — TSH: TSH: 1.03 u[IU]/mL (ref 0.450–4.500)

## 2020-10-16 LAB — HEMOGLOBIN A1C
Est. average glucose Bld gHb Est-mCnc: 123 mg/dL
Hgb A1c MFr Bld: 5.9 % — ABNORMAL HIGH (ref 4.8–5.6)

## 2020-10-16 LAB — COMPREHENSIVE METABOLIC PANEL
ALT: 17 IU/L (ref 0–32)
AST: 26 IU/L (ref 0–40)
Albumin/Globulin Ratio: 1.2 (ref 1.2–2.2)
Albumin: 3.9 g/dL (ref 3.8–4.8)
Alkaline Phosphatase: 69 IU/L (ref 44–121)
BUN/Creatinine Ratio: 21 (ref 12–28)
BUN: 15 mg/dL (ref 8–27)
Bilirubin Total: 0.4 mg/dL (ref 0.0–1.2)
CO2: 23 mmol/L (ref 20–29)
Calcium: 9.5 mg/dL (ref 8.7–10.3)
Chloride: 103 mmol/L (ref 96–106)
Creatinine, Ser: 0.72 mg/dL (ref 0.57–1.00)
GFR calc Af Amer: 104 mL/min/{1.73_m2} (ref >59)
GFR calc non Af Amer: 90 mL/min/{1.73_m2} (ref >59)
Globulin, Total: 3.3 g/dL (ref 1.5–4.5)
Glucose: 131 mg/dL — ABNORMAL HIGH (ref 65–99)
Potassium: 3.5 mmol/L (ref 3.5–5.2)
Sodium: 142 mmol/L (ref 134–144)
Total Protein: 7.2 g/dL (ref 6.0–8.5)

## 2020-10-16 LAB — CBC WITH DIFFERENTIAL
Basophils Absolute: 0 10*3/uL (ref 0.0–0.2)
Basos: 1 %
EOS (ABSOLUTE): 0.2 10*3/uL (ref 0.0–0.4)
Eos: 3 %
Hematocrit: 38.2 % (ref 34.0–46.6)
Hemoglobin: 12.3 g/dL (ref 11.1–15.9)
Immature Grans (Abs): 0 10*3/uL (ref 0.0–0.1)
Immature Granulocytes: 1 %
Lymphocytes Absolute: 2.9 10*3/uL (ref 0.7–3.1)
Lymphs: 46 %
MCH: 27.7 pg (ref 26.6–33.0)
MCHC: 32.2 g/dL (ref 31.5–35.7)
MCV: 86 fL (ref 79–97)
Monocytes Absolute: 0.7 10*3/uL (ref 0.1–0.9)
Monocytes: 11 %
Neutrophils Absolute: 2.4 10*3/uL (ref 1.4–7.0)
Neutrophils: 38 %
RBC: 4.44 x10E6/uL (ref 3.77–5.28)
RDW: 13.1 % (ref 11.7–15.4)
WBC: 6.1 10*3/uL (ref 3.4–10.8)

## 2020-10-16 LAB — LIPID PANEL
Chol/HDL Ratio: 4 ratio (ref 0.0–4.4)
Cholesterol, Total: 209 mg/dL — ABNORMAL HIGH (ref 100–199)
HDL: 52 mg/dL (ref >39)
LDL Chol Calc (NIH): 138 mg/dL — ABNORMAL HIGH (ref 0–99)
Triglycerides: 104 mg/dL (ref 0–149)
VLDL Cholesterol Cal: 19 mg/dL (ref 5–40)

## 2020-10-17 NOTE — Telephone Encounter (Signed)
Called pt for verification of medication

## 2020-10-17 NOTE — Telephone Encounter (Signed)
10/17/2020- PATIENT RETURNED NIKKI'S CALL. SHE SAID THE MEDICATION IS CHLORTHALIDONE (HYGROTEN) AND IT SHOULD BE 25 mg AND NOT 50 mg. BEST PHONE FOR PATIENT [phone]  MBC

## 2020-10-18 ENCOUNTER — Other Ambulatory Visit: Payer: Self-pay | Admitting: Registered Nurse

## 2020-10-18 DIAGNOSIS — I10 Essential (primary) hypertension: Secondary | ICD-10-CM

## 2020-11-10 ENCOUNTER — Other Ambulatory Visit: Payer: Self-pay | Admitting: Registered Nurse

## 2020-11-10 DIAGNOSIS — I10 Essential (primary) hypertension: Secondary | ICD-10-CM

## 2021-01-09 ENCOUNTER — Encounter: Payer: Self-pay | Admitting: Registered Nurse

## 2021-01-16 ENCOUNTER — Encounter: Payer: Self-pay | Admitting: Registered Nurse

## 2021-02-04 ENCOUNTER — Encounter: Payer: Self-pay | Admitting: Registered Nurse

## 2021-02-10 ENCOUNTER — Other Ambulatory Visit: Payer: Self-pay | Admitting: Registered Nurse

## 2021-02-10 DIAGNOSIS — I10 Essential (primary) hypertension: Secondary | ICD-10-CM

## 2021-03-11 ENCOUNTER — Other Ambulatory Visit: Payer: Self-pay | Admitting: Registered Nurse

## 2021-03-11 DIAGNOSIS — I10 Essential (primary) hypertension: Secondary | ICD-10-CM

## 2021-03-17 ENCOUNTER — Encounter: Payer: Self-pay | Admitting: Registered Nurse

## 2021-03-18 ENCOUNTER — Ambulatory Visit: Payer: Medicare HMO | Admitting: Registered Nurse

## 2021-03-25 ENCOUNTER — Ambulatory Visit (INDEPENDENT_AMBULATORY_CARE_PROVIDER_SITE_OTHER): Payer: Medicare HMO | Admitting: Registered Nurse

## 2021-03-25 ENCOUNTER — Encounter: Payer: Self-pay | Admitting: Registered Nurse

## 2021-03-25 ENCOUNTER — Other Ambulatory Visit: Payer: Self-pay

## 2021-03-25 VITALS — HR 80 | Temp 98.2°F | Resp 18 | Ht 67.0 in | Wt 317.4 lb

## 2021-03-25 DIAGNOSIS — R202 Paresthesia of skin: Secondary | ICD-10-CM | POA: Diagnosis not present

## 2021-03-25 DIAGNOSIS — R200 Anesthesia of skin: Secondary | ICD-10-CM | POA: Diagnosis not present

## 2021-03-25 DIAGNOSIS — Z23 Encounter for immunization: Secondary | ICD-10-CM | POA: Diagnosis not present

## 2021-03-25 DIAGNOSIS — R600 Localized edema: Secondary | ICD-10-CM | POA: Diagnosis not present

## 2021-03-25 DIAGNOSIS — R7303 Prediabetes: Secondary | ICD-10-CM

## 2021-03-25 LAB — CBC WITH DIFFERENTIAL/PLATELET
Basophils Absolute: 0 10*3/uL (ref 0.0–0.1)
Basophils Relative: 0.7 % (ref 0.0–3.0)
Eosinophils Absolute: 0.2 10*3/uL (ref 0.0–0.7)
Eosinophils Relative: 3.1 % (ref 0.0–5.0)
HCT: 38.7 % (ref 36.0–46.0)
Hemoglobin: 12.7 g/dL (ref 12.0–15.0)
Lymphocytes Relative: 43.7 % (ref 12.0–46.0)
Lymphs Abs: 2.9 10*3/uL (ref 0.7–4.0)
MCHC: 32.8 g/dL (ref 30.0–36.0)
MCV: 86.1 fl (ref 78.0–100.0)
Monocytes Absolute: 0.7 10*3/uL (ref 0.1–1.0)
Monocytes Relative: 10.1 % (ref 3.0–12.0)
Neutro Abs: 2.8 10*3/uL (ref 1.4–7.7)
Neutrophils Relative %: 42.4 % — ABNORMAL LOW (ref 43.0–77.0)
Platelets: 253 10*3/uL (ref 150.0–400.0)
RBC: 4.49 Mil/uL (ref 3.87–5.11)
RDW: 14.1 % (ref 11.5–15.5)
WBC: 6.6 10*3/uL (ref 4.0–10.5)

## 2021-03-25 LAB — COMPREHENSIVE METABOLIC PANEL
ALT: 14 U/L (ref 0–35)
AST: 20 U/L (ref 0–37)
Albumin: 3.8 g/dL (ref 3.5–5.2)
Alkaline Phosphatase: 55 U/L (ref 39–117)
BUN: 19 mg/dL (ref 6–23)
CO2: 28 mEq/L (ref 19–32)
Calcium: 9.5 mg/dL (ref 8.4–10.5)
Chloride: 102 mEq/L (ref 96–112)
Creatinine, Ser: 0.81 mg/dL (ref 0.40–1.20)
GFR: 77.3 mL/min (ref >60.00)
Glucose, Bld: 110 mg/dL — ABNORMAL HIGH (ref 70–99)
Potassium: 3.2 mEq/L — ABNORMAL LOW (ref 3.5–5.1)
Sodium: 142 mEq/L (ref 135–145)
Total Bilirubin: 0.5 mg/dL (ref 0.2–1.2)
Total Protein: 7.4 g/dL (ref 6.0–8.3)

## 2021-03-25 LAB — VITAMIN D 25 HYDROXY (VIT D DEFICIENCY, FRACTURES): VITD: 42.33 ng/mL (ref 30.00–100.00)

## 2021-03-25 LAB — B12 AND FOLATE PANEL
Folate: 17.7 ng/mL (ref >5.9)
Vitamin B-12: 451 pg/mL (ref 211–911)

## 2021-03-25 LAB — HEMOGLOBIN A1C: Hgb A1c MFr Bld: 6.4 % (ref 4.6–6.5)

## 2021-03-25 MED ORDER — FUROSEMIDE 20 MG PO TABS: 10.0000 mg | ORAL_TABLET | Freq: Every day | ORAL | 1 refills | Status: DC

## 2021-03-25 NOTE — Progress Notes (Signed)
Acute Office Visit  Subjective:    Patient ID: Mariah [name], female    DOB: 1956/11/07, 65 y.o.   MRN: [id number]  Chief Complaint  Patient presents with   Foot Pain    Patient has some left foot pain and also feels like a tingling sensation for about 2 weeks.     HPI Patient is in today for L foot pain  Onset 2 weeks ago Numbness and tingling along with sharper pain Top of foot proximal to toes, central foot, does not hit edges. Does not affect ROM  Hx of injury to R foot some years ago, but otherwise no hx of acute injury Swelling in R foot but not as much in L foot Has compression stockings that she uses occasionally  No known hx of vit d deficiency or b12 deficiency Hx of prediabetes  Past Medical History:  Diagnosis Date   Arthritis    oa   Headache    Hypertension    Shortness of breath dyspnea    with exertion    Past Surgical History:  Procedure Laterality Date   COLONOSCOPY WITH PROPOFOL N/A 03/12/2016   Procedure: COLONOSCOPY WITH PROPOFOL;  Surgeon: [name], MD;  Location: [facility];  Service: Endoscopy;  Laterality: N/A;   NO PAST SURGERIES      History reviewed. No pertinent family history.  Social History   Socioeconomic History   Marital status: Widowed    Spouse name: Not on file   Number of children: Not on file   Years of education: Not on file   Highest education level: Not on file  Occupational History   Not on file  Tobacco Use   Smoking status: Never   Smokeless tobacco: Never  Substance and Sexual Activity   Alcohol use: Yes    Comment: occc wine   Drug use: No   Sexual activity: Not on file  Other Topics Concern   Not on file  Social History Narrative   Not on file   Social Determinants of Health   Financial Resource Strain: Not on file  Food Insecurity: Not on file  Transportation Needs: Not on file  Physical Activity: Not on file  Stress: Not on file  Social Connections: Not on file  Intimate Partner  Violence: Not on file    Outpatient Medications Prior to Visit  Medication Sig Dispense Refill   CALCIUM CARBONATE PO Take 1 tablet by mouth daily.     chlorthalidone (HYGROTON) 50 MG tablet TAKE 1 TABLET (50 MG TOTAL) BY MOUTH DAILY. 90 tablet 1   Cholecalciferol (VITAMIN D3 PO) Take 1 tablet by mouth daily.     Multiple Vitamin (MULTIVITAMIN WITH MINERALS) TABS tablet Take 1 tablet by mouth daily.     metoprolol succinate (TOPROL-XL) 100 MG 24 hr tablet Take 1 tablet (100 mg total) by mouth daily. Take with or immediately following a meal. 90 tablet 1   valsartan (DIOVAN) 320 MG tablet TAKE 1 TABLET EVERY DAY 30 tablet 0   chlorthalidone (HYGROTON) 25 MG tablet TAKE 1 TABLET EVERY DAY 90 tablet 1   No facility-administered medications prior to visit.    Allergies  Allergen Reactions   Penicillins Shortness Of Breath, Swelling and Other (See Comments)    Has patient had a PCN reaction causing immediate rash, facial/tongue/throat swelling, SOB or lightheadedness with hypotension: yes Has patient had a PCN reaction causing severe rash involving mucus membranes or skin necrosis: no Has patient had a PCN reaction that required  hospitalization no Has patient had a PCN reaction occurring within the last 10 years: no If all of the above answers are "NO", then may proceed with Cephalosporin use.     Review of Systems Per hpi     Objective:    Physical Exam Vitals and nursing note reviewed.  Constitutional:      General: She is not in acute distress.    Appearance: Normal appearance. She is not ill-appearing, toxic-appearing or diaphoretic.  Cardiovascular:     Rate and Rhythm: Normal rate and regular rhythm.     Pulses: Normal pulses.     Heart sounds: Normal heart sounds. No murmur heard.   No friction rub. No gallop.  Pulmonary:     Effort: Pulmonary effort is normal. No respiratory distress.     Breath sounds: Normal breath sounds. No stridor. No wheezing, rhonchi or rales.   Chest:     Chest wall: No tenderness.  Musculoskeletal:        General: Tenderness present. No swelling, deformity or signs of injury. Normal range of motion.     Right lower leg: Edema present.     Left lower leg: Edema present.  Skin:    General: Skin is warm and dry.     Capillary Refill: Capillary refill takes less than 2 seconds.  Neurological:     General: No focal deficit present.     Mental Status: She is alert and oriented to person, place, and time. Mental status is at baseline.  Psychiatric:        Mood and Affect: Mood normal.        Behavior: Behavior normal.        Thought Content: Thought content normal.        Judgment: Judgment normal.    Pulse 80   Temp 98.2 F (36.8 C) (Temporal)   Resp 18   Ht 5\' 7"  (1.702 m)   Wt (!) 317 lb 6.4 oz (144 kg)   SpO2 99%   BMI 49.71 kg/m  Wt Readings from Last 3 Encounters:  07/31/21 (!) 314 lb 11.2 oz (142.7 kg)  05/28/21 (!) 326 lb 9.6 oz (148.1 kg)  03/25/21 (!) 317 lb 6.4 oz (144 kg)    Health Maintenance Due  Topic Date Due   COVID-19 Vaccine (3 - Booster for Pfizer series) 04/06/2020   INFLUENZA VACCINE  06/10/2021   MAMMOGRAM  08/28/2021    There are no preventive care reminders to display for this patient.   Lab Results  Component Value Date   TSH 1.030 10/15/2020   Lab Results  Component Value Date   WBC 6.6 03/25/2021   HGB 12.7 03/25/2021   HCT 38.7 03/25/2021   MCV 86.1 03/25/2021   PLT 253.0 03/25/2021   Lab Results  Component Value Date   NA 142 03/25/2021   K 3.2 (L) 03/25/2021   CO2 28 03/25/2021   GLUCOSE 110 (H) 03/25/2021   BUN 19 03/25/2021   CREATININE 0.81 03/25/2021   BILITOT 0.5 03/25/2021   ALKPHOS 55 03/25/2021   AST 20 03/25/2021   ALT 14 03/25/2021   PROT 7.4 03/25/2021   ALBUMIN 3.8 03/25/2021   CALCIUM 9.5 03/25/2021   GFR 77.30 03/25/2021   Lab Results  Component Value Date   CHOL 209 (H) 10/15/2020   Lab Results  Component Value Date   HDL 52 10/15/2020    Lab Results  Component Value Date   LDLCALC 138 (H) 10/15/2020   Lab Results  Component Value Date   TRIG 104 10/15/2020   Lab Results  Component Value Date   CHOLHDL 4.0 10/15/2020   Lab Results  Component Value Date   HGBA1C 6.4 03/25/2021       Assessment & Plan:   Problem List Items Addressed This Visit   None Visit Diagnoses     Need for varicella vaccine    -  Primary   Relevant Orders   Varicella-zoster vaccine IM (Shingrix) (Completed)   Paresthesia       Relevant Orders   CBC with Differential/Platelet (Completed)   Vitamin D (25 hydroxy) (Completed)   B12 and Folate Panel (Completed)   Numbness and tingling       Relevant Orders   CBC with Differential/Platelet (Completed)   Comprehensive metabolic panel (Completed)   Vitamin D (25 hydroxy) (Completed)   B12 and Folate Panel (Completed)   Prediabetes       Relevant Orders   Comprehensive metabolic panel (Completed)   Hemoglobin A1c (Completed)   Edema of right lower extremity            Meds ordered this encounter  Medications   DISCONTD: furosemide (LASIX) 20 MG tablet    Sig: Take 0.5 tablets (10 mg total) by mouth daily.    Dispense:  30 tablet    Refill:  1    Order Specific Question:   Supervising Provider    Answer:   Neva Seat, JEFFREY R [[id number]]   PLAN Unclear etiology. Perhaps vit d or b12 deficiency? Will check labs Low suspicion for dvt. Pt will monitor if it's worsening or failing to improve Lasix 10mg  PO qd PRN for RLE edema. Consider follow up with vascular. Patient encouraged to call clinic with any questions, comments, or concerns.   , NP

## 2021-03-25 NOTE — Patient Instructions (Addendum)
Mariah [name] -  Always great to see you  I'm sorry you're in pain. I have a few ideas for what we can use to treat you but I want to check today's labs first.  I'll be in touch soon with results.   Thank you  Rich     If you have lab work done today you will be contacted with your lab results within the next 2 weeks.  If you have not heard from [place] then please contact us. The fastest way to get your results is to register for My Chart.   IF you received an x-ray today, you will receive an invoice from [facility]. Please contact [facility] at [phone] with questions or concerns regarding your invoice.   IF you received labwork today, you will receive an invoice from [facility]. Please contact [facility] at [phone] with questions or concerns regarding your invoice.   Our billing staff will not be able to assist you with questions regarding bills from these companies.  You will be contacted with the lab results as soon as they are available. The fastest way to get your results is to activate your My Chart account. Instructions are located on the last page of this paperwork. If you have not heard from [place] regarding the results in 2 weeks, please contact this office.

## 2021-03-27 ENCOUNTER — Telehealth: Payer: Self-pay | Admitting: Registered Nurse

## 2021-03-27 ENCOUNTER — Other Ambulatory Visit: Payer: Self-pay

## 2021-03-27 DIAGNOSIS — I10 Essential (primary) hypertension: Secondary | ICD-10-CM

## 2021-03-27 MED ORDER — METOPROLOL SUCCINATE ER 100 MG PO TB24: 100.0000 mg | ORAL_TABLET | Freq: Every day | ORAL | 1 refills | Status: DC

## 2021-03-27 NOTE — Telephone Encounter (Signed)
Pharmacy called in asking for a new script of the metoprolol to be sent in.  Please advise

## 2021-03-27 NOTE — Telephone Encounter (Signed)
Medication sent to pharmacy  

## 2021-04-01 ENCOUNTER — Other Ambulatory Visit: Payer: Self-pay | Admitting: Registered Nurse

## 2021-04-01 DIAGNOSIS — E876 Hypokalemia: Secondary | ICD-10-CM

## 2021-04-01 MED ORDER — POTASSIUM CHLORIDE CRYS ER 20 MEQ PO TBCR: 20.0000 meq | EXTENDED_RELEASE_TABLET | Freq: Every day | ORAL | 3 refills | Status: DC

## 2021-04-25 ENCOUNTER — Other Ambulatory Visit: Payer: Self-pay | Admitting: Registered Nurse

## 2021-04-25 DIAGNOSIS — I10 Essential (primary) hypertension: Secondary | ICD-10-CM

## 2021-05-11 ENCOUNTER — Other Ambulatory Visit: Payer: Self-pay | Admitting: Registered Nurse

## 2021-05-11 DIAGNOSIS — I10 Essential (primary) hypertension: Secondary | ICD-10-CM

## 2021-05-28 ENCOUNTER — Encounter: Payer: Self-pay | Admitting: Registered Nurse

## 2021-05-28 ENCOUNTER — Other Ambulatory Visit: Payer: Self-pay

## 2021-05-28 ENCOUNTER — Ambulatory Visit (INDEPENDENT_AMBULATORY_CARE_PROVIDER_SITE_OTHER): Payer: Medicare HMO | Admitting: Registered Nurse

## 2021-05-28 VITALS — BP 121/69 | HR 64 | Temp 98.2°F | Resp 18 | Ht 67.0 in | Wt 326.6 lb

## 2021-05-28 DIAGNOSIS — R600 Localized edema: Secondary | ICD-10-CM

## 2021-05-28 DIAGNOSIS — M79642 Pain in left hand: Secondary | ICD-10-CM

## 2021-05-28 DIAGNOSIS — M7989 Other specified soft tissue disorders: Secondary | ICD-10-CM

## 2021-05-28 DIAGNOSIS — M79641 Pain in right hand: Secondary | ICD-10-CM

## 2021-05-28 DIAGNOSIS — Z23 Encounter for immunization: Secondary | ICD-10-CM | POA: Diagnosis not present

## 2021-05-28 DIAGNOSIS — E876 Hypokalemia: Secondary | ICD-10-CM | POA: Diagnosis not present

## 2021-05-28 MED ORDER — FUROSEMIDE 20 MG PO TABS: 20.0000 mg | ORAL_TABLET | Freq: Every day | ORAL | 1 refills | Status: DC

## 2021-05-28 MED ORDER — SEMAGLUTIDE(0.25 OR 0.5MG/DOS) 2 MG/1.5ML ~~LOC~~ SOPN
PEN_INJECTOR | SUBCUTANEOUS | 0 refills | Status: DC
Start: 2021-05-28 — End: 2021-07-14

## 2021-05-28 NOTE — Progress Notes (Signed)
Established Patient Office Visit  Subjective:  Patient ID: Mariah [name], female    DOB: 1956/06/03  Age: 65 y.o. MRN: [id number]  CC:  Chief Complaint  Patient presents with   Follow-up    Patient is here to follow up for Diabetes and shingles vaccine. Patient is having swelling in her right leg for two weels.    HPI Mariah [name] presents for follow up  Last A1c:  Lab Results  Component Value Date   HGBA1C 6.4 03/25/2021    Currently taking: lifestyle control No new complications Reports good compliance with medications Diet has been steady, improving Exercise habits have been limited due to chronic pain  She notes that she has trouble losing weight despite what she reports to be a good diet, mostly veggies, adequate but limited portion sizes She was referred to healthy weight and wellness but notes visits there were too expensive for her to go consistently Interested in any and all options to help her lose weight.  Ongoing dependent edema, but lately R >> L, no pain or redness. Has OTC compression stockings but they are painful due to the extent of her swelling Has oa in both knees but trouble losing weight to be candidate for surgery.   Due for shingles vaccine #2. Tolerated first dose well.    Past Medical History:  Diagnosis Date   Arthritis    oa   Headache    Hypertension    Shortness of breath dyspnea    with exertion    Past Surgical History:  Procedure Laterality Date   COLONOSCOPY WITH PROPOFOL N/A 03/12/2016   Procedure: COLONOSCOPY WITH PROPOFOL;  Surgeon: [name], MD;  Location: [facility];  Service: Endoscopy;  Laterality: N/A;   NO PAST SURGERIES      No family history on file.  Social History   Socioeconomic History   Marital status: Widowed    Spouse name: Not on file   Number of children: Not on file   Years of education: Not on file   Highest education level: Not on file  Occupational History   Not on file  Tobacco Use    Smoking status: Never   Smokeless tobacco: Never  Substance and Sexual Activity   Alcohol use: Yes    Comment: occc wine   Drug use: No   Sexual activity: Not on file  Other Topics Concern   Not on file  Social History Narrative   Not on file   Social Determinants of Health   Financial Resource Strain: Not on file  Food Insecurity: Not on file  Transportation Needs: Not on file  Physical Activity: Not on file  Stress: Not on file  Social Connections: Not on file  Intimate Partner Violence: Not on file    Outpatient Medications Prior to Visit  Medication Sig Dispense Refill   CALCIUM CARBONATE PO Take 1 tablet by mouth daily.     chlorthalidone (HYGROTON) 50 MG tablet TAKE 1 TABLET (50 MG TOTAL) BY MOUTH DAILY. 90 tablet 1   Cholecalciferol (VITAMIN D3 PO) Take 1 tablet by mouth daily.     furosemide (LASIX) 20 MG tablet Take 0.5 tablets (10 mg total) by mouth daily. 30 tablet 1   metoprolol succinate (TOPROL-XL) 100 MG 24 hr tablet Take 1 tablet (100 mg total) by mouth daily. Take with or immediately following a meal. 90 tablet 1   Multiple Vitamin (MULTIVITAMIN WITH MINERALS) TABS tablet Take 1 tablet by mouth daily.  potassium chloride SA (KLOR-CON) 20 MEQ tablet Take 1 tablet (20 mEq total) by mouth daily. 90 tablet 3   valsartan (DIOVAN) 320 MG tablet TAKE 1 TABLET EVERY DAY 90 tablet 0   No facility-administered medications prior to visit.    Allergies  Allergen Reactions   Penicillins Shortness Of Breath, Swelling and Other (See Comments)    Has patient had a PCN reaction causing immediate rash, facial/tongue/throat swelling, SOB or lightheadedness with hypotension: yes Has patient had a PCN reaction causing severe rash involving mucus membranes or skin necrosis: no Has patient had a PCN reaction that required hospitalization no Has patient had a PCN reaction occurring within the last 10 years: no If all of the above answers are "NO", then may proceed with  Cephalosporin use.     ROS Review of Systems  Constitutional: Negative.   HENT: Negative.    Eyes: Negative.   Respiratory: Negative.    Cardiovascular: Negative.   Gastrointestinal: Negative.   Genitourinary: Negative.   Musculoskeletal: Negative.   Skin: Negative.   Neurological: Negative.   Psychiatric/Behavioral: Negative.    All other systems reviewed and are negative.    Objective:    Physical Exam Vitals and nursing note reviewed.  Constitutional:      General: She is not in acute distress.    Appearance: Normal appearance. She is normal weight. She is not ill-appearing, toxic-appearing or diaphoretic.  Cardiovascular:     Rate and Rhythm: Normal rate and regular rhythm.     Heart sounds: Normal heart sounds. No murmur heard.   No friction rub. No gallop.  Pulmonary:     Effort: Pulmonary effort is normal. No respiratory distress.     Breath sounds: Normal breath sounds. No stridor. No wheezing, rhonchi or rales.  Chest:     Chest wall: No tenderness.  Musculoskeletal:        General: No swelling, tenderness, deformity or signs of injury.     Right lower leg: Edema (nonpitting) present.     Left lower leg: Edema (nonpitting) present.  Skin:    General: Skin is warm and dry.  Neurological:     General: No focal deficit present.     Mental Status: She is alert and oriented to person, place, and time. Mental status is at baseline.  Psychiatric:        Mood and Affect: Mood normal.        Behavior: Behavior normal.        Thought Content: Thought content normal.        Judgment: Judgment normal.    BP 121/69   Pulse 64   Temp 98.2 F (36.8 C) (Temporal)   Resp 18   Ht 5\' 7"  (1.702 m)   Wt (!) 326 lb 9.6 oz (148.1 kg)   SpO2 99%   BMI 51.15 kg/m  Wt Readings from Last 3 Encounters:  05/28/21 (!) 326 lb 9.6 oz (148.1 kg)  03/25/21 (!) 317 lb 6.4 oz (144 kg)  10/15/20 (!) 325 lb 6.4 oz (147.6 kg)     Health Maintenance Due  Topic Date Due    Zoster Vaccines- Shingrix (2 of 2) 05/20/2021    There are no preventive care reminders to display for this patient.  Lab Results  Component Value Date   TSH 1.030 10/15/2020   Lab Results  Component Value Date   WBC 6.6 03/25/2021   HGB 12.7 03/25/2021   HCT 38.7 03/25/2021   MCV 86.1 03/25/2021  PLT 253.0 03/25/2021   Lab Results  Component Value Date   NA 142 03/25/2021   K 3.2 (L) 03/25/2021   CO2 28 03/25/2021   GLUCOSE 110 (H) 03/25/2021   BUN 19 03/25/2021   CREATININE 0.81 03/25/2021   BILITOT 0.5 03/25/2021   ALKPHOS 55 03/25/2021   AST 20 03/25/2021   ALT 14 03/25/2021   PROT 7.4 03/25/2021   ALBUMIN 3.8 03/25/2021   CALCIUM 9.5 03/25/2021   GFR 77.30 03/25/2021   Lab Results  Component Value Date   CHOL 209 (H) 10/15/2020   Lab Results  Component Value Date   HDL 52 10/15/2020   Lab Results  Component Value Date   LDLCALC 138 (H) 10/15/2020   Lab Results  Component Value Date   TRIG 104 10/15/2020   Lab Results  Component Value Date   CHOLHDL 4.0 10/15/2020   Lab Results  Component Value Date   HGBA1C 6.4 03/25/2021      Assessment & Plan:   Problem List Items Addressed This Visit       Other   Morbid obesity with body mass index (BMI) greater than or equal to 50 (HCC)   Relevant Medications   Semaglutide,0.25 or 0.5MG /DOS, 2 MG/1.5ML SOPN   Other Visit Diagnoses     Need for varicella vaccine    -  Primary   Relevant Orders   Varicella-zoster vaccine IM   Hypokalemia       Relevant Orders   Basic Metabolic Panel (BMET)   Right leg swelling       Relevant Orders   D-Dimer, Quantitative   Bilateral hand pain       Relevant Orders   Antinuclear Antib (ANA)   Bilateral leg edema       Relevant Orders   Ambulatory referral to Vascular Surgery   Edema of right lower extremity       Relevant Medications   furosemide (LASIX) 20 MG tablet       No orders of the defined types were placed in this  encounter.   Follow-up: No follow-ups on file.   PLAN Reviewed R/b/se of semaglutide with patient, will proceed as above and titrate to 0.5mg  weekly. Encouraged her to continue lifestyle modifications  Refer to vascular surgery for dependent edema. Low risk for dvt based on symptoms and chronicity, but will order d-dimer to help rule out Lasix increase to 20mg  PO qd. Checking bmet for hx of hypokalemia. Encourage potassium rich foods Patient encouraged to call clinic with any questions, comments, or concerns.  , NP

## 2021-05-28 NOTE — Patient Instructions (Addendum)
Ms. Mariah [name] to see you as always  In brief:  Start semaglutide weekly injection for weight loss Labs today will show potassium levels and indicate if there may be a clot I have refilled fluid pill - increase to one full tablet I will let you know if we need to increase potassium supplement. I have placed a referral to vascular - they will call you soon  Thank you  Rich     If you have lab work done today you will be contacted with your lab results within the next 2 weeks.  If you have not heard from [place] then please contact us. The fastest way to get your results is to register for My Chart.   IF you received an x-ray today, you will receive an invoice from [facility]. Please contact [facility] at [phone] with questions or concerns regarding your invoice.   IF you received labwork today, you will receive an invoice from [place]. Please contact [facility] at [phone] with questions or concerns regarding your invoice.   Our billing staff will not be able to assist you with questions regarding bills from these companies.  You will be contacted with the lab results as soon as they are available. The fastest way to get your results is to activate your My Chart account. Instructions are located on the last page of this paperwork. If you have not heard from [place] regarding the results in 2 weeks, please contact this office.

## 2021-05-30 ENCOUNTER — Encounter: Payer: Self-pay | Admitting: Registered Nurse

## 2021-06-05 ENCOUNTER — Encounter: Payer: Self-pay | Admitting: Registered Nurse

## 2021-07-14 ENCOUNTER — Other Ambulatory Visit: Payer: Self-pay | Admitting: Registered Nurse

## 2021-07-16 MED ORDER — SEMAGLUTIDE(0.25 OR 0.5MG/DOS) 2 MG/1.5ML ~~LOC~~ SOPN: PEN_INJECTOR | SUBCUTANEOUS | 0 refills | Status: DC

## 2021-07-16 NOTE — Telephone Encounter (Signed)
LFD 05/28/21 4.45mL with no refill LOV 05/28/21 NOV none

## 2021-07-21 ENCOUNTER — Other Ambulatory Visit: Payer: Self-pay | Admitting: Registered Nurse

## 2021-07-21 DIAGNOSIS — R600 Localized edema: Secondary | ICD-10-CM

## 2021-07-22 ENCOUNTER — Other Ambulatory Visit: Payer: Self-pay | Admitting: Registered Nurse

## 2021-07-22 MED ORDER — SEMAGLUTIDE(0.25 OR 0.5MG/DOS) 2 MG/1.5ML ~~LOC~~ SOPN: PEN_INJECTOR | SUBCUTANEOUS | 0 refills | Status: DC

## 2021-07-23 ENCOUNTER — Other Ambulatory Visit: Payer: Self-pay

## 2021-07-23 DIAGNOSIS — M7989 Other specified soft tissue disorders: Secondary | ICD-10-CM

## 2021-07-31 ENCOUNTER — Other Ambulatory Visit: Payer: Self-pay

## 2021-07-31 ENCOUNTER — Encounter: Payer: Self-pay | Admitting: Physician Assistant

## 2021-07-31 ENCOUNTER — Ambulatory Visit (HOSPITAL_COMMUNITY)
Admission: RE | Admit: 2021-07-31 | Discharge: 2021-07-31 | Disposition: A | Discharge status: Home / Self Care | Payer: Medicare HMO | Source: Ambulatory Visit | Attending: Vascular Surgery | Admitting: Vascular Surgery

## 2021-07-31 ENCOUNTER — Ambulatory Visit: Payer: Medicare HMO | Admitting: Physician Assistant

## 2021-07-31 VITALS — BP 171/104 | HR 71 | Temp 98.4°F | Resp 20 | Ht 67.0 in | Wt 314.7 lb

## 2021-07-31 DIAGNOSIS — M7989 Other specified soft tissue disorders: Secondary | ICD-10-CM | POA: Diagnosis not present

## 2021-07-31 DIAGNOSIS — I872 Venous insufficiency (chronic) (peripheral): Secondary | ICD-10-CM | POA: Diagnosis not present

## 2021-07-31 DIAGNOSIS — E119 Type 2 diabetes mellitus without complications: Secondary | ICD-10-CM | POA: Diagnosis not present

## 2021-07-31 NOTE — Progress Notes (Signed)
[facility] [place]   Reason for referral: Swollen B legs  History of Present Illness  Mariah [name] is a 65 y.o. female who presents with chief complaint: swollen leg.  Patient notes, onset of swelling years ago, associated with dependent positions.  She states after she stopped working she does not do much and lays in the bed to watch TV most of the day.    The patient has had no history of DVT, no history of varicose vein, no history of venous stasis ulcers, no history of  Lymphedema and no history of skin changes in lower legs.  There is positive family history of venous disorders.  The patient has used Knee high compression stockings in the past.  Her PCP has put her on Lasix which seems to help some with the edema.  She has tried some diet changes to loose weight and she is not a smoker.  She has a history of DM and HTN.  Past Medical History:  Diagnosis Date   Arthritis    oa   Headache    Hypertension    Shortness of breath dyspnea    with exertion    Past Surgical History:  Procedure Laterality Date   COLONOSCOPY WITH PROPOFOL N/A 03/12/2016   Procedure: COLONOSCOPY WITH PROPOFOL;  Surgeon: [name], MD;  Location: [facility];  Service: Endoscopy;  Laterality: N/A;   NO PAST SURGERIES      Social History   Socioeconomic History   Marital status: Widowed    Spouse name: Not on file   Number of children: Not on file   Years of education: Not on file   Highest education level: Not on file  Occupational History   Not on file  Tobacco Use   Smoking status: Never   Smokeless tobacco: Never  Substance and Sexual Activity   Alcohol use: Yes    Comment: occc wine   Drug use: No   Sexual activity: Not on file  Other Topics Concern   Not on file  Social History Narrative   Not on file   Social Determinants of Health   Financial Resource Strain: Not on file  Food Insecurity: Not on file  Transportation Needs: Not on file  Physical  Activity: Not on file  Stress: Not on file  Social Connections: Not on file  Intimate Partner Violence: Not on file    History reviewed. No pertinent family history.  Current Outpatient Medications on File Prior to Visit  Medication Sig Dispense Refill   CALCIUM CARBONATE PO Take 1 tablet by mouth daily.     chlorthalidone (HYGROTON) 50 MG tablet TAKE 1 TABLET (50 MG TOTAL) BY MOUTH DAILY. 90 tablet 1   Cholecalciferol (VITAMIN D3 PO) Take 1 tablet by mouth daily.     furosemide (LASIX) 20 MG tablet TAKE 1 TABLET EVERY DAY 60 tablet 2   metoprolol succinate (TOPROL-XL) 100 MG 24 hr tablet Take 1 tablet (100 mg total) by mouth daily. Take with or immediately following a meal. 90 tablet 1   Multiple Vitamin (MULTIVITAMIN WITH MINERALS) TABS tablet Take 1 tablet by mouth daily.     potassium chloride SA (KLOR-CON) 20 MEQ tablet Take 1 tablet (20 mEq total) by mouth daily. 90 tablet 3   Semaglutide,0.25 or 0.5MG /DOS, 2 MG/1.5ML SOPN Inject 0.25 mg into the skin once a week for 56 days, THEN 0.5 mg once a week. 4.5 mL 0   valsartan (DIOVAN) 320 MG tablet TAKE 1 TABLET  EVERY DAY 90 tablet 0   No current facility-administered medications on file prior to visit.    Allergies as of 07/31/2021 - Review Complete 07/31/2021  Allergen Reaction Noted   Penicillins Shortness Of Breath, Swelling, and Other (See Comments) 04/12/2015     ROS:   General:  No weight loss, Fever, chills  HEENT: No recent headaches, no nasal bleeding, no visual changes, no sore throat  Neurologic: No dizziness, blackouts, seizures. No recent symptoms of stroke or mini- stroke. No recent episodes of slurred speech, or temporary blindness.  Cardiac: No recent episodes of chest pain/pressure, no shortness of breath at rest.  No shortness of breath with exertion.  Denies history of atrial fibrillation or irregular heartbeat  Vascular: No history of rest pain in feet.  No history of claudication.  No history of  non-healing ulcer, No history of DVT   Pulmonary: No home oxygen, no productive cough, no hemoptysis,  No asthma or wheezing  Musculoskeletal:  [ ]  Arthritis, [ ]  Low back pain,  [x ] Joint pain  Hematologic:No history of hypercoagulable state.  No history of easy bleeding.  No history of anemia  Gastrointestinal: No hematochezia or melena,  No gastroesophageal reflux, no trouble swallowing  Urinary: [ ]  chronic Kidney disease, [ ]  on HD - [ ]  MWF or [ ]  TTHS, [ ]  Burning with urination, [ ]  Frequent urination, [ ]  Difficulty urinating;   Skin: No rashes  Psychological: No history of anxiety,  No history of depression  Physical Examination  Vitals:   07/31/21 1012  BP: (!) 171/104  Pulse: 71  Resp: 20  Temp: 98.4 F (36.9 C)  TempSrc: Temporal  SpO2: 99%  Weight: (!) 314 lb 11.2 oz (142.7 kg)  Height: 5\' 7"  (1.702 m)    Body mass index is 49.29 kg/m.  General:  Alert and oriented, no acute distress HEENT: Normal Neck: No bruit or JVD Pulmonary: Clear to auscultation bilaterally Cardiac: Regular Rate and Rhythm without murmur Abdomen: Soft, non-tender, non-distended, no mass, no scars Skin: No rash Extremity Pulses:  2+ radial, brachial, femoral, doppler signals brisk dorsalis pedis, posterior tibial  bilaterally Musculoskeletal: No deformity or edema  Neurologic: Upper and lower extremity motor grossly intact and symmetric  DATA: Venous Reflux Times  +--------------+---------+------+-----------+------------+--------+  RIGHT         Reflux NoRefluxReflux TimeDiameter cmsComments                          Yes                                   +--------------+---------+------+-----------+------------+--------+  CFV           no                                              +--------------+---------+------+-----------+------------+--------+  FV mid        no                                               +--------------+---------+------+-----------+------------+--------+  Popliteal     no                                              +--------------+---------+------+-----------+------------+--------+  GSV at [facility] [address]              yes    >500 ms      0.74              +--------------+---------+------+-----------+------------+--------+  GSV prox thighno                            0.63              +--------------+---------+------+-----------+------------+--------+  GSV mid thigh           yes    >500 ms      0.48              +--------------+---------+------+-----------+------------+--------+  GSV dist thighno                            0.37              +--------------+---------+------+-----------+------------+--------+  GSV at knee   no                            0.25              +--------------+---------+------+-----------+------------+--------+  GSV prox calf                               0.26              +--------------+---------+------+-----------+------------+--------+  GSV mid calf  no                            0.30              +--------------+---------+------+-----------+------------+--------+  SSV Pop Fossa no                            0.36              +--------------+---------+------+-----------+------------+--------+  SSV prox calf no                            0.28              +--------------+---------+------+-----------+------------+--------+  SSV mid calf  no                            0.20              +--------------+---------+------+-----------+------------+--------+      +--------------+---------+------+-----------+------------+--------+  LEFT          Reflux NoRefluxReflux TimeDiameter cmsComments                          Yes                                   +--------------+---------+------+-----------+------------+--------+  CFV                     yes   >1 second                        +--------------+---------+------+-----------+------------+--------+  FV mid  no                                              +--------------+---------+------+-----------+------------+--------+  Popliteal     no                                              +--------------+---------+------+-----------+------------+--------+  GSV at [facility]              yes    >500 ms      0.71              +--------------+---------+------+-----------+------------+--------+  GSV prox thighno                            0.65              +--------------+---------+------+-----------+------------+--------+  GSV mid thigh no                            0.47              +--------------+---------+------+-----------+------------+--------+  GSV dist thighno                            0.50              +--------------+---------+------+-----------+------------+--------+  GSV at knee   no                            0.19              +--------------+---------+------+-----------+------------+--------+  GSV prox calf no                            0.24              +--------------+---------+------+-----------+------------+--------+  GSV mid calf  no                            0.26              +--------------+---------+------+-----------+------------+--------+  SSV Pop Fossa no                            0.39              +--------------+---------+------+-----------+------------+--------+  SSV prox calf no                            0.38              +--------------+---------+------+-----------+------------+--------+  SSV mid calf  no                            0.34              +--------------+---------+------+-----------+------------+--------+      Summary:  Right:  - No evidence of deep vein thrombosis seen in the right lower extremity,  from the common femoral through the popliteal veins.  -  No evidence of  superficial venous reflux seen in the right short  saphenous vein.  - Venous reflux is noted in the right sapheno-femoral junction.  - Venous reflux is noted in the right greater saphenous vein in the mid  thigh.     Left:  - No evidence of deep vein thrombosis seen in the left lower extremity,  from the common femoral through the popliteal veins.  - No evidence of superficial venous reflux seen in the left greater  saphenous vein.  - No evidence of superficial venous reflux seen in the left short  saphenous vein.  - Venous reflux is noted in the left common femoral vein.  - Venous reflux is noted in the left sapheno-femoral junction.     Assessment: Venous reflux B LE with edema right > left  Right LE Venous reflux is noted in the right sapheno-femoral junction.  - Venous reflux is noted in the right greater saphenous vein in the mid  thigh.   Left LE Venous reflux is noted in the left common femoral vein.  - Venous reflux is noted in the left sapheno-femoral junction.   The above reflux is stable without distal skin problems.  The right LE has more edema than the left.  She has history of right knee OA and right ankle fracture.     She has good doppler signals without evidence of skin changes, non healing wounds or weeping from the skin.  She is not at risk of loosing a limb.    Plan:  Her best success with decreased edema is exercise and weight loss.  I do not feel she can tolerate thigh high compression at this time due to her size.  She will continue with knee high compression daily, elevation of her LE a few times a day above her heart exercise.  If she develops non healing wounds, weeping of the skin or skin changes she will call our office.  F/U PRN in the future.    Mosetta Pigeon PA-C [facility] [place] Office: [phone]  MD in clinic [facility]

## 2021-08-04 ENCOUNTER — Other Ambulatory Visit: Payer: Self-pay | Admitting: Registered Nurse

## 2021-08-05 MED ORDER — SEMAGLUTIDE(0.25 OR 0.5MG/DOS) 2 MG/1.5ML ~~LOC~~ SOPN: PEN_INJECTOR | SUBCUTANEOUS | 0 refills | Status: DC

## 2021-09-12 ENCOUNTER — Telehealth: Payer: Self-pay | Admitting: Registered Nurse

## 2021-09-12 NOTE — Telephone Encounter (Signed)
Left message for patient to call back and schedule Medicare Annual Wellness Visit (AWV) in office.  ° °If not able to come in office, please offer to do virtually or by telephone.  Left office number and my jabber #[phone]. ° °Due for AWVI ° °Please schedule at anytime with Nurse Health Advisor. °  °

## 2021-09-24 DIAGNOSIS — Z01419 Encounter for gynecological examination (general) (routine) without abnormal findings: Secondary | ICD-10-CM | POA: Diagnosis not present

## 2021-09-24 DIAGNOSIS — Z6841 Body Mass Index (BMI) 40.0 and over, adult: Secondary | ICD-10-CM | POA: Diagnosis not present

## 2021-09-24 DIAGNOSIS — Z1231 Encounter for screening mammogram for malignant neoplasm of breast: Secondary | ICD-10-CM | POA: Diagnosis not present

## 2021-09-27 ENCOUNTER — Encounter: Payer: Self-pay | Admitting: Registered Nurse

## 2021-09-27 ENCOUNTER — Ambulatory Visit (INDEPENDENT_AMBULATORY_CARE_PROVIDER_SITE_OTHER): Payer: Medicare HMO | Admitting: Registered Nurse

## 2021-09-27 VITALS — BP 146/92 | HR 69 | Temp 98.2°F | Ht 67.0 in | Wt 314.0 lb

## 2021-09-27 DIAGNOSIS — Z13228 Encounter for screening for other metabolic disorders: Secondary | ICD-10-CM

## 2021-09-27 DIAGNOSIS — Z0000 Encounter for general adult medical examination without abnormal findings: Secondary | ICD-10-CM

## 2021-09-27 DIAGNOSIS — R202 Paresthesia of skin: Secondary | ICD-10-CM | POA: Diagnosis not present

## 2021-09-27 DIAGNOSIS — I10 Essential (primary) hypertension: Secondary | ICD-10-CM | POA: Diagnosis not present

## 2021-09-27 DIAGNOSIS — Z23 Encounter for immunization: Secondary | ICD-10-CM | POA: Diagnosis not present

## 2021-09-27 DIAGNOSIS — E559 Vitamin D deficiency, unspecified: Secondary | ICD-10-CM | POA: Diagnosis not present

## 2021-09-27 DIAGNOSIS — E876 Hypokalemia: Secondary | ICD-10-CM | POA: Diagnosis not present

## 2021-09-27 DIAGNOSIS — R600 Localized edema: Secondary | ICD-10-CM | POA: Diagnosis not present

## 2021-09-27 DIAGNOSIS — Z1329 Encounter for screening for other suspected endocrine disorder: Secondary | ICD-10-CM

## 2021-09-27 DIAGNOSIS — Z130 Encounter for screening for diseases of the blood and blood-forming organs and certain disorders involving the immune mechanism: Secondary | ICD-10-CM | POA: Diagnosis not present

## 2021-09-27 DIAGNOSIS — Z13220 Encounter for screening for lipoid disorders: Secondary | ICD-10-CM | POA: Diagnosis not present

## 2021-09-27 MED ORDER — CHLORTHALIDONE 50 MG PO TABS: 50.0000 mg | ORAL_TABLET | Freq: Every day | ORAL | 1 refills | Status: DC

## 2021-09-27 MED ORDER — FUROSEMIDE 20 MG PO TABS: 20.0000 mg | ORAL_TABLET | Freq: Every day | ORAL | 2 refills | Status: DC

## 2021-09-27 MED ORDER — POTASSIUM CHLORIDE CRYS ER 20 MEQ PO TBCR: 20.0000 meq | EXTENDED_RELEASE_TABLET | Freq: Every day | ORAL | 3 refills | Status: DC

## 2021-09-27 MED ORDER — VALSARTAN 320 MG PO TABS: 320.0000 mg | ORAL_TABLET | Freq: Every day | ORAL | 1 refills | Status: DC

## 2021-09-27 MED ORDER — METOPROLOL SUCCINATE ER 100 MG PO TB24: 100.0000 mg | ORAL_TABLET | Freq: Every day | ORAL | 1 refills | Status: DC

## 2021-09-27 NOTE — Progress Notes (Signed)
Established Patient Office Visit  Subjective:  Patient ID: Mariah [name], female    DOB: 26-Jan-1956  Age: 65 y.o. MRN: [id number]  CC:  Chief Complaint  Patient presents with   Annual Exam    CPE, no concerns. Patient fasting.     HPI Mariah [name] presents for CPE  No acute concerns.  Histories reviewed and updated with patient.   Hypertension: Patient Currently taking: chlorthalidone 50mg  po qd, metoprolol 100mg  XR po qd, valsartan 320mg  po qd, furosemide 20mg  po qd prn for dependent edema. Taking klor-con daily due to resulting hypokalemia.  Good effect. No AEs. Denies CV symptoms including: chest pain, shob, doe, headache, visual changes, fatigue, claudication, and dependent edema.   Previous readings and labs: BP Readings from Last 3 Encounters:  09/27/21 (!) 146/92  07/31/21 (!) 171/104  05/28/21 121/69   Lab Results  Component Value Date   CREATININE 0.81 03/25/2021    Would like flu shot today. No AE in past. No concerning symptoms today.   Past Medical History:  Diagnosis Date   Arthritis    oa   Headache    Hypertension    Shortness of breath dyspnea    with exertion    Past Surgical History:  Procedure Laterality Date   COLONOSCOPY WITH PROPOFOL N/A 03/12/2016   Procedure: COLONOSCOPY WITH PROPOFOL;  Surgeon: 08/02/21, MD;  Location: [facility];  Service: Endoscopy;  Laterality: N/A;   NO PAST SURGERIES      History reviewed. No pertinent family history.  Social History   Socioeconomic History   Marital status: Widowed    Spouse name: Not on file   Number of children: Not on file   Years of education: Not on file   Highest education level: Not on file  Occupational History   Not on file  Tobacco Use   Smoking status: Never   Smokeless tobacco: Never  Vaping Use   Vaping Use: Never used  Substance and Sexual Activity   Alcohol use: Yes    Comment: occc wine   Drug use: No   Sexual activity: Not Currently  Other Topics  Concern   Not on file  Social History Narrative   Not on file   Social Determinants of Health   Financial Resource Strain: Not on file  Food Insecurity: Not on file  Transportation Needs: Not on file  Physical Activity: Not on file  Stress: Not on file  Social Connections: Not on file  Intimate Partner Violence: Not on file    Outpatient Medications Prior to Visit  Medication Sig Dispense Refill   CALCIUM CARBONATE PO Take 1 tablet by mouth daily.     Cholecalciferol (VITAMIN D3 PO) Take 1 tablet by mouth daily.     Multiple Vitamin (MULTIVITAMIN WITH MINERALS) TABS tablet Take 1 tablet by mouth daily.     Semaglutide,0.25 or 0.5MG /DOS, 2 MG/1.5ML SOPN Inject 0.25 mg into the skin once a week for 56 days, THEN 0.5 mg once a week. 4.5 mL 0   chlorthalidone (HYGROTON) 50 MG tablet TAKE 1 TABLET (50 MG TOTAL) BY MOUTH DAILY. 90 tablet 1   furosemide (LASIX) 20 MG tablet TAKE 1 TABLET EVERY DAY 60 tablet 2   metoprolol succinate (TOPROL-XL) 100 MG 24 hr tablet Take 1 tablet (100 mg total) by mouth daily. Take with or immediately following a meal. 90 tablet 1   potassium chloride SA (KLOR-CON) 20 MEQ tablet Take 1 tablet (20 mEq total) by mouth daily. 90  tablet 3   valsartan (DIOVAN) 320 MG tablet TAKE 1 TABLET EVERY DAY 90 tablet 0   No facility-administered medications prior to visit.    Allergies  Allergen Reactions   Penicillins Shortness Of Breath, Swelling and Other (See Comments)    Has patient had a PCN reaction causing immediate rash, facial/tongue/throat swelling, SOB or lightheadedness with hypotension: yes Has patient had a PCN reaction causing severe rash involving mucus membranes or skin necrosis: no Has patient had a PCN reaction that required hospitalization no Has patient had a PCN reaction occurring within the last 10 years: no If all of the above answers are "NO", then may proceed with Cephalosporin use.     ROS Review of Systems  Constitutional: Negative.    HENT: Negative.    Eyes: Negative.   Respiratory: Negative.    Cardiovascular: Negative.   Gastrointestinal: Negative.   Genitourinary: Negative.   Musculoskeletal: Negative.   Skin: Negative.   Neurological: Negative.   Psychiatric/Behavioral: Negative.    All other systems reviewed and are negative.    Objective:    Physical Exam Vitals and nursing note reviewed.  Constitutional:      General: She is not in acute distress.    Appearance: Normal appearance. She is obese. She is not ill-appearing, toxic-appearing or diaphoretic.  HENT:     Head: Normocephalic and atraumatic.     Right Ear: Tympanic membrane, ear canal and external ear normal. There is no impacted cerumen.     Left Ear: Tympanic membrane, ear canal and external ear normal. There is no impacted cerumen.     Nose: Nose normal. No congestion or rhinorrhea.     Mouth/Throat:     Mouth: Mucous membranes are moist.     Pharynx: Oropharynx is clear. No oropharyngeal exudate or posterior oropharyngeal erythema.  Eyes:     General: No scleral icterus.       Right eye: No discharge.        Left eye: No discharge.     Extraocular Movements: Extraocular movements intact.     Conjunctiva/sclera: Conjunctivae normal.     Pupils: Pupils are equal, round, and reactive to light.  Cardiovascular:     Rate and Rhythm: Normal rate and regular rhythm.     Pulses: Normal pulses.     Heart sounds: Normal heart sounds. No murmur heard.   No friction rub. No gallop.  Pulmonary:     Effort: Pulmonary effort is normal. No respiratory distress.     Breath sounds: Normal breath sounds. No stridor. No wheezing, rhonchi or rales.  Chest:     Chest wall: No tenderness.  Abdominal:     General: Abdomen is flat. Bowel sounds are normal. There is no distension.     Palpations: Abdomen is soft. There is no mass.     Tenderness: There is no abdominal tenderness. There is no right CVA tenderness, left CVA tenderness, guarding or rebound.      Hernia: No hernia is present.  Musculoskeletal:        General: No swelling, tenderness, deformity or signs of injury. Normal range of motion.     Right lower leg: No edema.     Left lower leg: No edema.  Skin:    General: Skin is warm and dry.     Capillary Refill: Capillary refill takes less than 2 seconds.     Coloration: Skin is not jaundiced or pale.     Findings: No bruising, erythema, lesion or rash.  Neurological:     General: No focal deficit present.     Mental Status: She is alert and oriented to person, place, and time. Mental status is at baseline.     Cranial Nerves: No cranial nerve deficit.     Sensory: No sensory deficit.     Motor: No weakness.     Coordination: Coordination normal.     Gait: Gait normal.     Deep Tendon Reflexes: Reflexes normal.  Psychiatric:        Mood and Affect: Mood normal.        Behavior: Behavior normal.        Thought Content: Thought content normal.        Judgment: Judgment normal.    BP (!) 146/92 (BP Location: Left Arm, Patient Position: Sitting, Cuff Size: Large)   Pulse 69   Temp 98.2 F (36.8 C) (Temporal)   Ht 5\' 7"  (1.702 m)   Wt (!) 314 lb (142.4 kg)   SpO2 96%   BMI 49.18 kg/m  Wt Readings from Last 3 Encounters:  09/27/21 (!) 314 lb (142.4 kg)  07/31/21 (!) 314 lb 11.2 oz (142.7 kg)  05/28/21 (!) 326 lb 9.6 oz (148.1 kg)     Health Maintenance Due  Topic Date Due   INFLUENZA VACCINE  06/10/2021   MAMMOGRAM  08/28/2021    There are no preventive care reminders to display for this patient.  Lab Results  Component Value Date   TSH 1.030 10/15/2020   Lab Results  Component Value Date   WBC 6.6 03/25/2021   HGB 12.7 03/25/2021   HCT 38.7 03/25/2021   MCV 86.1 03/25/2021   PLT 253.0 03/25/2021   Lab Results  Component Value Date   NA 142 03/25/2021   K 3.2 (L) 03/25/2021   CO2 28 03/25/2021   GLUCOSE 110 (H) 03/25/2021   BUN 19 03/25/2021   CREATININE 0.81 03/25/2021   BILITOT 0.5 03/25/2021    ALKPHOS 55 03/25/2021   AST 20 03/25/2021   ALT 14 03/25/2021   PROT 7.4 03/25/2021   ALBUMIN 3.8 03/25/2021   CALCIUM 9.5 03/25/2021   GFR 77.30 03/25/2021   Lab Results  Component Value Date   CHOL 209 (H) 10/15/2020   Lab Results  Component Value Date   HDL 52 10/15/2020   Lab Results  Component Value Date   LDLCALC 138 (H) 10/15/2020   Lab Results  Component Value Date   TRIG 104 10/15/2020   Lab Results  Component Value Date   CHOLHDL 4.0 10/15/2020   Lab Results  Component Value Date   HGBA1C 6.4 03/25/2021      Assessment & Plan:   Problem List Items Addressed This Visit   None Visit Diagnoses     Annual physical exam    -  Primary   Essential hypertension       Relevant Medications   valsartan (DIOVAN) 320 MG tablet   furosemide (LASIX) 20 MG tablet   metoprolol succinate (TOPROL-XL) 100 MG 24 hr tablet   chlorthalidone (HYGROTON) 50 MG tablet   Edema of right lower extremity       Relevant Medications   furosemide (LASIX) 20 MG tablet   Hypokalemia       Relevant Medications   potassium chloride SA (KLOR-CON) 20 MEQ tablet   Screening for endocrine, metabolic and immunity disorder       Relevant Orders   CBC with Differential/Platelet   Comprehensive metabolic panel   Hemoglobin  A1c   TSH   Lipid screening       Relevant Orders   Lipid panel   Flu vaccine need       Relevant Orders   Flu Vaccine QUAD 6+ mos PF IM (Fluarix Quad PF)       Meds ordered this encounter  Medications   valsartan (DIOVAN) 320 MG tablet    Sig: Take 1 tablet (320 mg total) by mouth daily.    Dispense:  90 tablet    Refill:  1    Order Specific Question:   Supervising Provider    Answer:   Neva Seat, JEFFREY R [[id number]]   furosemide (LASIX) 20 MG tablet    Sig: Take 1 tablet (20 mg total) by mouth daily.    Dispense:  60 tablet    Refill:  2    Order Specific Question:   Supervising Provider    Answer:   Neva Seat, JEFFREY R [[id number]]   metoprolol succinate  (TOPROL-XL) 100 MG 24 hr tablet    Sig: Take 1 tablet (100 mg total) by mouth daily. Take with or immediately following a meal.    Dispense:  90 tablet    Refill:  1    Order Specific Question:   Supervising Provider    Answer:   Neva Seat, JEFFREY R [[id number]]   potassium chloride SA (KLOR-CON) 20 MEQ tablet    Sig: Take 1 tablet (20 mEq total) by mouth daily.    Dispense:  90 tablet    Refill:  3    Order Specific Question:   Supervising Provider    Answer:   Neva Seat, JEFFREY R [[id number]]   chlorthalidone (HYGROTON) 50 MG tablet    Sig: Take 1 tablet (50 mg total) by mouth daily.    Dispense:  90 tablet    Refill:  1    Order Specific Question:   Supervising Provider    Answer:   Neva Seat, JEFFREY R [[id number]]    Follow-up: Return in about 6 months (around 03/27/2022) for HTN.   PLAN Exam unremarkable despite portions limited by body habitus. Labs collected. Will follow up with the patient as warranted. Refill meds x 6 mo Encourage pt to continue pursuit of weight loss. Patient encouraged to call clinic with any questions, comments, or concerns.  Janeece Agee, NP

## 2021-09-27 NOTE — Patient Instructions (Signed)
Ms. Chong January to see you!  No concerns today - let's check what labs have to say.  See you in 6 mo, sooner if you need anything.  Thank you!  Rich

## 2021-09-28 LAB — CBC WITH DIFFERENTIAL/PLATELET
Absolute Monocytes: 461 cells/uL (ref 200–950)
Basophils Absolute: 32 cells/uL (ref 0–200)
Basophils Relative: 0.6 %
Eosinophils Absolute: 159 cells/uL (ref 15–500)
Eosinophils Relative: 3 %
HCT: 38.3 % (ref 35.0–45.0)
Hemoglobin: 12.6 g/dL (ref 11.7–15.5)
Lymphs Abs: 2268 cells/uL (ref 850–3900)
MCH: 27.8 pg (ref 27.0–33.0)
MCHC: 32.9 g/dL (ref 32.0–36.0)
MCV: 84.4 fL (ref 80.0–100.0)
MPV: 11.3 fL (ref 7.5–12.5)
Monocytes Relative: 8.7 %
Neutro Abs: 2380 cells/uL (ref 1500–7800)
Neutrophils Relative %: 44.9 %
Platelets: 281 10*3/uL (ref 140–400)
RBC: 4.54 10*6/uL (ref 3.80–5.10)
RDW: 13.2 % (ref 11.0–15.0)
Total Lymphocyte: 42.8 %
WBC: 5.3 10*3/uL (ref 3.8–10.8)

## 2021-09-28 LAB — COMPREHENSIVE METABOLIC PANEL
AG Ratio: 1 (calc) (ref 1.0–2.5)
ALT: 13 U/L (ref 6–29)
AST: 20 U/L (ref 10–35)
Albumin: 3.6 g/dL (ref 3.6–5.1)
Alkaline phosphatase (APISO): 62 U/L (ref 37–153)
BUN: 16 mg/dL (ref 7–25)
CO2: 28 mmol/L (ref 20–32)
Calcium: 9.1 mg/dL (ref 8.6–10.4)
Chloride: 100 mmol/L (ref 98–110)
Creat: 0.87 mg/dL (ref 0.50–1.05)
Globulin: 3.6 g/dL (calc) (ref 1.9–3.7)
Glucose, Bld: 101 mg/dL — ABNORMAL HIGH (ref 65–99)
Potassium: 3.2 mmol/L — ABNORMAL LOW (ref 3.5–5.3)
Sodium: 139 mmol/L (ref 135–146)
Total Bilirubin: 0.4 mg/dL (ref 0.2–1.2)
Total Protein: 7.2 g/dL (ref 6.1–8.1)

## 2021-09-28 LAB — HEMOGLOBIN A1C
Hgb A1c MFr Bld: 5.7 % of total Hgb — ABNORMAL HIGH (ref <5.7)
Mean Plasma Glucose: 117 mg/dL
eAG (mmol/L): 6.5 mmol/L

## 2021-09-28 LAB — LIPID PANEL
Cholesterol: 184 mg/dL (ref <200)
HDL: 53 mg/dL (ref >=50)
LDL Cholesterol (Calc): 108 mg/dL (calc) — ABNORMAL HIGH
Non-HDL Cholesterol (Calc): 131 mg/dL (calc) — ABNORMAL HIGH (ref <130)
Total CHOL/HDL Ratio: 3.5 (calc) (ref <5.0)
Triglycerides: 121 mg/dL (ref <150)

## 2021-09-28 LAB — B12 AND FOLATE PANEL
Folate: 17.4 ng/mL
Vitamin B-12: 1065 pg/mL (ref 200–1100)

## 2021-09-28 LAB — TSH: TSH: 0.83 mIU/L (ref 0.40–4.50)

## 2021-09-28 LAB — VITAMIN D 25 HYDROXY (VIT D DEFICIENCY, FRACTURES): Vit D, 25-Hydroxy: 45 ng/mL (ref 30–100)

## 2021-10-07 ENCOUNTER — Encounter: Payer: Self-pay | Admitting: Registered Nurse

## 2021-10-08 NOTE — Telephone Encounter (Signed)
Would need vv  Thanks  Rich

## 2021-10-09 ENCOUNTER — Encounter: Payer: Self-pay | Admitting: Registered Nurse

## 2021-10-09 ENCOUNTER — Telehealth (INDEPENDENT_AMBULATORY_CARE_PROVIDER_SITE_OTHER): Payer: Medicare HMO | Admitting: Registered Nurse

## 2021-10-09 ENCOUNTER — Other Ambulatory Visit: Payer: Self-pay

## 2021-10-09 DIAGNOSIS — R051 Acute cough: Secondary | ICD-10-CM | POA: Diagnosis not present

## 2021-10-09 MED ORDER — BENZONATATE 200 MG PO CAPS: 200.0000 mg | ORAL_CAPSULE | Freq: Two times a day (BID) | ORAL | 0 refills | Status: DC | PRN

## 2021-10-09 MED ORDER — DM-GUAIFENESIN ER 30-600 MG PO TB12: 1.0000 | ORAL_TABLET | Freq: Two times a day (BID) | ORAL | 0 refills | Status: DC

## 2021-10-09 MED ORDER — AZELASTINE HCL 0.1 % NA SOLN: 1.0000 | Freq: Two times a day (BID) | NASAL | 12 refills | Status: DC

## 2021-10-09 NOTE — Patient Instructions (Signed)
° ° ° °  If you have lab work done today you will be contacted with your lab results within the next 2 weeks.  If you have not heard from us then please contact us. The fastest way to get your results is to register for My Chart. ° ° °IF you received an x-ray today, you will receive an invoice from [facility]. Please contact [facility] at [phone] with questions or concerns regarding your invoice.  ° °IF you received labwork today, you will receive an invoice from [facility]. Please contact [facility] at [phone] with questions or concerns regarding your invoice.  ° °Our billing staff will not be able to assist you with questions regarding bills from these companies. ° °You will be contacted with the lab results as soon as they are available. The fastest way to get your results is to activate your My Chart account. Instructions are located on the last page of this paperwork. If you have not heard from us regarding the results in 2 weeks, please contact this office. °  ° ° ° °

## 2021-10-09 NOTE — Progress Notes (Signed)
Telemedicine Encounter- SOAP NOTE Established Patient  This telephone encounter was conducted with the patient's (or proxy's) verbal consent via audio telecommunications: yes/no: Yes Patient was instructed to have this encounter in a suitably private space; and to only have persons present to whom they give permission to participate. In addition, patient identity was confirmed by use of name plus two identifiers (DOB and address).  I discussed the limitations, risks, security and privacy concerns of performing an evaluation and management service by telephone and the availability of in person appointments. I also discussed with the patient that there may be a patient responsible charge related to this service. The patient expressed understanding and agreed to proceed.  I spent a total of 16 minutes talking with the patient or their proxy.  Patient at home Provider in office  Participants: Jari Sportsman, NP and Loletta Specter  Chief Complaint  Patient presents with   Cough    Patient states she has been experiencing flu symptom for about a week. She has been taking some otc cold medication. Her daughter and grandkids have the flu    Subjective   Mariah [name] is a 65 y.o. established patient. Telephone visit today for flu like symptoms  HPI Onset around a week ago. Multiple close contacts testing positive for flu including multiple members of household.   Onset with cough, fatigue, sleep disturbance due to cough Headache, hoarse voice, sore throat   OTC medication - peppermint tea - cough drops - some relief, but no nighttime relief.   Cough is productive with clearish mucus  Denies fevers, chills, fatigue, chest pain, nvd, shob, sensory changes.   Patient Active Problem List   Diagnosis Date Noted   Arthritis 08/29/2019   Morbid obesity with body mass index (BMI) greater than or equal to 50 (HCC) 08/29/2019   Hypertensive disorder 06/20/2019    Past Medical History:   Diagnosis Date   Arthritis    oa   Headache    Hypertension    Shortness of breath dyspnea    with exertion    Current Outpatient Medications  Medication Sig Dispense Refill   azelastine (ASTELIN) 0.1 % nasal spray Place 1 spray into both nostrils 2 (two) times daily. Use in each nostril as directed 30 mL 12   benzonatate (TESSALON) 200 MG capsule Take 1 capsule (200 mg total) by mouth 2 (two) times daily as needed for cough. 20 capsule 0   CALCIUM CARBONATE PO Take 1 tablet by mouth daily.     chlorthalidone (HYGROTON) 50 MG tablet Take 1 tablet (50 mg total) by mouth daily. 90 tablet 1   Cholecalciferol (VITAMIN D3 PO) Take 1 tablet by mouth daily.     dextromethorphan-guaiFENesin (MUCINEX DM) 30-600 MG 12hr tablet Take 1 tablet by mouth 2 (two) times daily. 20 tablet 0   furosemide (LASIX) 20 MG tablet Take 1 tablet (20 mg total) by mouth daily. 60 tablet 2   metoprolol succinate (TOPROL-XL) 100 MG 24 hr tablet Take 1 tablet (100 mg total) by mouth daily. Take with or immediately following a meal. 90 tablet 1   Multiple Vitamin (MULTIVITAMIN WITH MINERALS) TABS tablet Take 1 tablet by mouth daily.     potassium chloride SA (KLOR-CON) 20 MEQ tablet Take 1 tablet (20 mEq total) by mouth daily. 90 tablet 3   Semaglutide,0.25 or 0.5MG /DOS, 2 MG/1.5ML SOPN Inject 0.25 mg into the skin once a week for 56 days, THEN 0.5 mg once a week. 4.5 mL 0  valsartan (DIOVAN) 320 MG tablet Take 1 tablet (320 mg total) by mouth daily. 90 tablet 1   No current facility-administered medications for this visit.    Allergies  Allergen Reactions   Penicillins Shortness Of Breath, Swelling and Other (See Comments)    Has patient had a PCN reaction causing immediate rash, facial/tongue/throat swelling, SOB or lightheadedness with hypotension: yes Has patient had a PCN reaction causing severe rash involving mucus membranes or skin necrosis: no Has patient had a PCN reaction that required hospitalization  no Has patient had a PCN reaction occurring within the last 10 years: no If all of the above answers are "NO", then may proceed with Cephalosporin use.     Social History   Socioeconomic History   Marital status: Widowed    Spouse name: Not on file   Number of children: Not on file   Years of education: Not on file   Highest education level: Not on file  Occupational History   Not on file  Tobacco Use   Smoking status: Never   Smokeless tobacco: Never  Vaping Use   Vaping Use: Never used  Substance and Sexual Activity   Alcohol use: Yes    Comment: occc wine   Drug use: No   Sexual activity: Not Currently  Other Topics Concern   Not on file  Social History Narrative   Not on file   Social Determinants of Health   Financial Resource Strain: Not on file  Food Insecurity: Not on file  Transportation Needs: Not on file  Physical Activity: Not on file  Stress: Not on file  Social Connections: Not on file  Intimate Partner Violence: Not on file    ROS Per hpi   Objective   Vitals as reported by the patient: There were no vitals filed for this visit.  [name] was seen today for cough.  Diagnoses and all orders for this visit:  Acute cough -     benzonatate (TESSALON) 200 MG capsule; Take 1 capsule (200 mg total) by mouth 2 (two) times daily as needed for cough. -     dextromethorphan-guaiFENesin (MUCINEX DM) 30-600 MG 12hr tablet; Take 1 tablet by mouth 2 (two) times daily. -     azelastine (ASTELIN) 0.1 % nasal spray; Place 1 spray into both nostrils 2 (two) times daily. Use in each nostril as directed   PLAN With nighttime cough being only concerning symptom lingering for patient, will treat this as above.  Defer on abx treatment - can consider if symptoms persist or worsen in coming 48 hours.  Discussed supportive care with rest and hydration. Patient encouraged to call clinic with any questions, comments, or concerns.  I discussed the assessment and  treatment plan with the patient. The patient was provided an opportunity to ask questions and all were answered. The patient agreed with the plan and demonstrated an understanding of the instructions.   The patient was advised to call back or seek an in-person evaluation if the symptoms worsen or if the condition fails to improve as anticipated.  I provided 16 minutes of non-face-to-face time during this encounter.  Janeece Agee, NP

## 2021-11-27 ENCOUNTER — Encounter: Payer: Self-pay | Admitting: Registered Nurse

## 2021-11-27 ENCOUNTER — Other Ambulatory Visit: Payer: Self-pay | Admitting: Registered Nurse

## 2021-11-27 MED ORDER — SEMAGLUTIDE(0.25 OR 0.5MG/DOS) 2 MG/1.5ML ~~LOC~~ SOPN: PEN_INJECTOR | SUBCUTANEOUS | 0 refills | Status: DC

## 2021-12-01 ENCOUNTER — Other Ambulatory Visit: Payer: Self-pay | Admitting: Registered Nurse

## 2021-12-02 ENCOUNTER — Encounter: Payer: Self-pay | Admitting: Registered Nurse

## 2021-12-02 MED ORDER — SEMAGLUTIDE(0.25 OR 0.5MG/DOS) 2 MG/1.5ML ~~LOC~~ SOPN: PEN_INJECTOR | SUBCUTANEOUS | 0 refills | Status: DC

## 2021-12-03 NOTE — Telephone Encounter (Signed)
Patient paper has been faxed back to [facility].

## 2021-12-03 NOTE — Telephone Encounter (Signed)
I have just faxed the paper back to [facility].

## 2021-12-08 ENCOUNTER — Other Ambulatory Visit: Payer: Self-pay | Admitting: Registered Nurse

## 2021-12-08 DIAGNOSIS — I10 Essential (primary) hypertension: Secondary | ICD-10-CM

## 2021-12-09 ENCOUNTER — Encounter: Payer: Self-pay | Admitting: Registered Nurse

## 2021-12-10 ENCOUNTER — Encounter: Payer: Self-pay | Admitting: Registered Nurse

## 2021-12-26 NOTE — Telephone Encounter (Signed)
Good Afternoon,   Per our billing and coding department for this type of immunization it is typically covered under Part D which is the drug prescription plan. I do not see any forms on file for part D for you, however you should be able to file this on your own by following the link below so that you can be reimbursed for the cost. I apologize for any inconvenience as we do not file this on behalf of patients.   Reece Agar, RMA

## 2021-12-26 NOTE — Telephone Encounter (Signed)
Good Afternoon,  ° °Per our billing and coding department for this type of immunization it is typically covered under Part D which is the drug prescription plan. I do not see any forms on file for part D for you, however you should be able to file this on your own by following the link below so that you can be reimbursed for the cost. I apologize for any inconvenience as we do not file this on behalf of patients. ° ° °Best,  °Zeyna Mkrtchyan, RMA  ° °  °

## 2021-12-26 NOTE — Telephone Encounter (Signed)
Can you explain this to the patient as I am very unfamiliar with insurance and I do not wish to misinform the patient and cause more of an issues especially seeing as the patient is already upset about the situation, I do not feel I am well versed enough to answer her questions

## 2021-12-27 NOTE — Telephone Encounter (Signed)
Pt is threatening legal counseling

## 2022-01-01 NOTE — Telephone Encounter (Signed)
Sent message to billing for them to contact her regarding this matter.

## 2022-02-05 ENCOUNTER — Other Ambulatory Visit: Payer: Self-pay | Admitting: Registered Nurse

## 2022-02-14 ENCOUNTER — Other Ambulatory Visit: Payer: Self-pay | Admitting: Registered Nurse

## 2022-02-14 DIAGNOSIS — R600 Localized edema: Secondary | ICD-10-CM

## 2022-03-06 ENCOUNTER — Other Ambulatory Visit: Payer: Self-pay | Admitting: Registered Nurse

## 2022-03-06 DIAGNOSIS — I10 Essential (primary) hypertension: Secondary | ICD-10-CM

## 2022-03-25 ENCOUNTER — Ambulatory Visit (INDEPENDENT_AMBULATORY_CARE_PROVIDER_SITE_OTHER): Payer: Medicare HMO | Admitting: Registered Nurse

## 2022-03-25 ENCOUNTER — Encounter: Payer: Self-pay | Admitting: Registered Nurse

## 2022-03-25 VITALS — BP 142/82 | HR 68 | Temp 98.1°F | Resp 18 | Ht 67.0 in | Wt 307.2 lb

## 2022-03-25 DIAGNOSIS — I10 Essential (primary) hypertension: Secondary | ICD-10-CM

## 2022-03-25 DIAGNOSIS — M7989 Other specified soft tissue disorders: Secondary | ICD-10-CM | POA: Diagnosis not present

## 2022-03-25 MED ORDER — SEMAGLUTIDE (1 MG/DOSE) 4 MG/3ML ~~LOC~~ SOPN: 1.0000 mg | PEN_INJECTOR | SUBCUTANEOUS | 0 refills | Status: DC

## 2022-03-25 NOTE — Patient Instructions (Addendum)
Ms. Kwasnik - ? ?Always great to see you ? ?Call with concerns ? ?Increase ozempic to 1mg  weekly ? ?See you in 3 mo to follow up ? ?Thanks, ? ? ?Rich  ? ? ? ?If you have lab work done today you will be contacted with your lab results within the next 2 weeks.  If you have not heard from then please contact us. The fastest way to get your results is to register for My Chart. ? ? ?IF you received an x-ray today, you will receive an invoice from [facility]. Please contact [facility] at [phone] with questions or concerns regarding your invoice.  ? ?IF you received labwork today, you will receive an invoice from [facility]. Please contact [facility] at [phone] with questions or concerns regarding your invoice.  ? ?Our billing staff will not be able to assist you with questions regarding bills from these companies. ? ?You will be contacted with the lab results as soon as they are available. The fastest way to get your results is to activate your My Chart account. Instructions are located on the last page of this paperwork. If you have not heard from [phone] regarding the results in 2 weeks, please contact this office. ?  ? ? ?

## 2022-03-25 NOTE — Assessment & Plan Note (Signed)
Out of range. Will pursue weight management with increased semaglutide and lifestyle management. Recheck in 3 mo ?

## 2022-03-25 NOTE — Assessment & Plan Note (Signed)
Not warm, red, or painful. Likely related to remote ankle injury or perhaps vascular issues. Recommend compression stockings, exercise, continued weight loss.  ?

## 2022-03-25 NOTE — Assessment & Plan Note (Signed)
Increase semaglutide to 1mg  sub1 weekly. ?Med check in 3 mo ?

## 2022-03-25 NOTE — Progress Notes (Signed)
? ?Established Patient Office Visit ? ?Subjective:  ?Patient ID: Mariah [name], female    DOB: 02-19-1956  Age: 66 y.o. MRN: [id number] ? ?CC:  ?Chief Complaint  ?Patient presents with  ? Hypertension  ?  Patient states she is here for follow up on HTN and some right ankle swelling after walking.  ? ? ?HPI ?Scotti Kiddoo presents for htn ? ?Hypertension: ?Patient Currently taking: chlorthalidone 50mg  po qd, furosemide 20mg  po qd, valsartan 320mg  po qd ?Good effect. No AEs. ?Denies CV symptoms including: chest pain, shob, doe, headache, visual changes, fatigue, claudication, and dependent edema.  ? ?Previous readings and labs: ?BP Readings from Last 3 Encounters:  ?03/25/22 (!) 142/82  ?09/27/21 (!) 146/92  ?07/31/21 (!) 171/104  ? ?Lab Results  ?Component Value Date  ? CREATININE 0.87 09/27/2021  ? ? ?Weight ?Continues to take ozempic  ?Doing well, no concerns. ?Would like to increase dose.  ? ?Outpatient Medications Prior to Visit  ?Medication Sig Dispense Refill  ? CALCIUM CARBONATE PO Take 1 tablet by mouth daily.    ? chlorthalidone (HYGROTON) 50 MG tablet TAKE 1 TABLET EVERY DAY 90 tablet 1  ? Cholecalciferol (VITAMIN D3 PO) Take 1 tablet by mouth daily.    ? furosemide (LASIX) 20 MG tablet TAKE 1 TABLET EVERY DAY 90 tablet 1  ? metoprolol succinate (TOPROL-XL) 100 MG 24 hr tablet TAKE 1 TABLET DAILY WITH OR IMMEDIATELY FOLLOWING A MEAL. 90 tablet 1  ? Multiple Vitamin (MULTIVITAMIN WITH MINERALS) TABS tablet Take 1 tablet by mouth daily.    ? potassium chloride SA (KLOR-CON) 20 MEQ tablet Take 1 tablet (20 mEq total) by mouth daily. 90 tablet 3  ? valsartan (DIOVAN) 320 MG tablet TAKE 1 TABLET (320 MG TOTAL) DAILY. 90 tablet 1  ? OZEMPIC, 0.25 OR 0.5 MG/DOSE, 2 MG/1.5ML SOPN INJECT  0.5MG  SUBCUTANEOUSLY EVERY WEEK 4.5 mL 3  ? azelastine (ASTELIN) 0.1 % nasal spray Place 1 spray into both nostrils 2 (two) times daily. Use in each nostril as directed 30 mL 12  ? benzonatate (TESSALON) 200 MG capsule Take 1  capsule (200 mg total) by mouth 2 (two) times daily as needed for cough. 20 capsule 0  ? dextromethorphan-guaiFENesin (MUCINEX DM) 30-600 MG 12hr tablet Take 1 tablet by mouth 2 (two) times daily. 20 tablet 0  ? ?No facility-administered medications prior to visit.  ? ? ?Review of Systems  ?Constitutional: Negative.   ?HENT: Negative.    ?Eyes: Negative.   ?Respiratory: Negative.    ?Cardiovascular: Negative.   ?Gastrointestinal: Negative.   ?Genitourinary: Negative.   ?Musculoskeletal: Negative.   ?Skin: Negative.   ?Neurological: Negative.   ?Psychiatric/Behavioral: Negative.    ?All other systems reviewed and are negative. ? ?  ?Objective:  ?  ? ?BP (!) 142/82   Pulse 68   Temp 98.1 ?F (36.7 ?C) (Temporal)   Resp 18   Ht 5\' 7"  (1.702 m)   Wt (!) 307 lb 3.2 oz (139.3 kg)   SpO2 96%   BMI 48.11 kg/m?  ? ?Wt Readings from Last 3 Encounters:  ?03/25/22 (!) 307 lb 3.2 oz (139.3 kg)  ?09/27/21 (!) 314 lb (142.4 kg)  ?07/31/21 (!) 314 lb 11.2 oz (142.7 kg)  ? ?Physical Exam ?Vitals and nursing note reviewed.  ?Constitutional:   ?   General: She is not in acute distress. ?   Appearance: Normal appearance. She is normal weight. She is not ill-appearing, toxic-appearing or diaphoretic.  ?Cardiovascular:  ?   Rate  and Rhythm: Normal rate and regular rhythm.  ?   Heart sounds: Normal heart sounds. No murmur heard. ?  No friction rub. No gallop.  ?Pulmonary:  ?   Effort: Pulmonary effort is normal. No respiratory distress.  ?   Breath sounds: Normal breath sounds. No stridor. No wheezing, rhonchi or rales.  ?Chest:  ?   Chest wall: No tenderness.  ?Musculoskeletal:  ?   Right lower leg: Edema present.  ?Skin: ?   General: Skin is warm and dry.  ?   Capillary Refill: Capillary refill takes less than 2 seconds.  ?Neurological:  ?   General: No focal deficit present.  ?   Mental Status: She is alert and oriented to person, place, and time. Mental status is at baseline.  ?Psychiatric:     ?   Mood and Affect: Mood normal.      ?   Behavior: Behavior normal.     ?   Thought Content: Thought content normal.     ?   Judgment: Judgment normal.  ? ? ?No results found for any visits on 03/25/22. ? ? ? ?The 10-year ASCVD risk score (Arnett DK, et al., 2019) is: 10.4% ? ?  ?Assessment & Plan:  ? ?Problem List Items Addressed This Visit   ? ?  ? Cardiovascular and Mediastinum  ? Hypertensive disorder  ?  Out of range. Will pursue weight management with increased semaglutide and lifestyle management. Recheck in 3 mo ? ?  ?  ?  ? Other  ? Morbid obesity with body mass index (BMI) greater than or equal to 50 [facility])  ?  Increase semaglutide to 1mg  sub1 weekly. ?Med check in 3 mo ? ?  ?  ? Relevant Medications  ? Semaglutide, 1 MG/DOSE, 4 MG/3ML SOPN  ? Right leg swelling - Primary  ?  Not warm, red, or painful. Likely related to remote ankle injury or perhaps vascular issues. Recommend compression stockings, exercise, continued weight loss.  ? ?  ?  ? ? ?Meds ordered this encounter  ?Medications  ? Semaglutide, 1 MG/DOSE, 4 MG/3ML SOPN  ?  Sig: Inject 1 mg as directed once a week.  ?  Dispense:  9 mL  ?  Refill:  0  ?  Order Specific Question:   Supervising Provider  ?  Answer:   Carlota Raspberry, JEFFREY R [[id number]]  ? ? ?Return in about 3 months (around 06/25/2022) for Ozempic.  ? ? ?Maximiano Coss, NP ?

## 2022-03-27 ENCOUNTER — Ambulatory Visit: Payer: Medicare HMO | Admitting: Registered Nurse

## 2022-04-16 ENCOUNTER — Telehealth: Payer: Self-pay | Admitting: Registered Nurse

## 2022-04-16 NOTE — Telephone Encounter (Signed)
Left message for patient to call back and schedule Medicare Annual Wellness Visit (AWV). Please offer to do virtually or by telephone.  Left office number and my jabber #[phone]. ? ?Due for AWVI ? ?Please schedule at anytime with Nurse Health Advisor. ?  ?

## 2022-04-24 ENCOUNTER — Ambulatory Visit (INDEPENDENT_AMBULATORY_CARE_PROVIDER_SITE_OTHER): Payer: Medicare HMO

## 2022-04-24 DIAGNOSIS — Z0000 Encounter for general adult medical examination without abnormal findings: Secondary | ICD-10-CM

## 2022-04-24 NOTE — Progress Notes (Cosign Needed)
Subjective:   Mariah [name] is a 66 y.o. female who presents for Medicare Annual Initial  preventive examination.   I connected with Allien Melberg  today by telephone and verified that I am speaking with the correct person using two identifiers. Location patient: home Location provider: work Persons participating in the virtual visit: patient, provider.   I discussed the limitations, risks, security and privacy concerns of performing an evaluation and management service by telephone and the availability of in person appointments. I also discussed with the patient that there may be a patient responsible charge related to this service. The patient expressed understanding and verbally consented to this telephonic visit.    Interactive audio and video telecommunications were attempted between this provider and patient, however failed, due to patient having technical difficulties OR patient did not have access to video capability.  We continued and completed visit with audio only.    Review of Systems     Cardiac Risk Factors include: advanced age (>78men, >27 women)     Objective:    Today's Vitals   There is no height or weight on file to calculate BMI.     04/24/2022    1:51 PM 12/07/2019   11:04 AM 03/12/2016    8:06 AM 03/10/2016    9:35 AM 07/02/2015   11:27 AM 04/12/2015    6:16 PM  Advanced Directives  Does Patient Have a Medical Advance Directive? No No No No No No  Copy of Healthcare Power of Attorney in Chart?    No - copy requested    Would patient like information on creating a medical advance directive? No - Patient declined Yes (Inpatient - patient defers creating a medical advance directive at this time - Information given) Yes - Educational materials given No - patient declined information Yes - Transport planner given Yes - Educational materials given    Current Medications (verified) Outpatient Encounter Medications as of 04/24/2022  Medication Sig   CALCIUM  CARBONATE PO Take 1 tablet by mouth daily.   chlorthalidone (HYGROTON) 50 MG tablet TAKE 1 TABLET EVERY DAY   Cholecalciferol (VITAMIN D3 PO) Take 1 tablet by mouth daily.   furosemide (LASIX) 20 MG tablet TAKE 1 TABLET EVERY DAY   metoprolol succinate (TOPROL-XL) 100 MG 24 hr tablet TAKE 1 TABLET DAILY WITH OR IMMEDIATELY FOLLOWING A MEAL.   Multiple Vitamin (MULTIVITAMIN WITH MINERALS) TABS tablet Take 1 tablet by mouth daily.   potassium chloride SA (KLOR-CON) 20 MEQ tablet Take 1 tablet (20 mEq total) by mouth daily.   Semaglutide, 1 MG/DOSE, 4 MG/3ML SOPN Inject 1 mg as directed once a week.   valsartan (DIOVAN) 320 MG tablet TAKE 1 TABLET (320 MG TOTAL) DAILY.   No facility-administered encounter medications on file as of 04/24/2022.    Allergies (verified) Penicillins   History: Past Medical History:  Diagnosis Date   Arthritis    oa   Headache    Hypertension    Shortness of breath dyspnea    with exertion   Past Surgical History:  Procedure Laterality Date   COLONOSCOPY WITH PROPOFOL N/A 03/12/2016   Procedure: COLONOSCOPY WITH PROPOFOL;  Surgeon: [name], MD;  Location: [facility];  Service: Endoscopy;  Laterality: N/A;   NO PAST SURGERIES     History reviewed. No pertinent family history. Social History   Socioeconomic History   Marital status: Widowed    Spouse name: Not on file   Number of children: Not on file  Years of education: Not on file   Highest education level: Not on file  Occupational History   Not on file  Tobacco Use   Smoking status: Never   Smokeless tobacco: Never  Vaping Use   Vaping Use: Never used  Substance and Sexual Activity   Alcohol use: Yes    Comment: occc wine   Drug use: No   Sexual activity: Not Currently  Other Topics Concern   Not on file  Social History Narrative   Not on file   Social Determinants of Health   Financial Resource Strain: Low Risk  (04/24/2022)   Overall Financial Resource Strain (CARDIA)     Difficulty of Paying Living Expenses: Not hard at all  Food Insecurity: No Food Insecurity (04/24/2022)   Hunger Vital Sign    Worried About Running Out of Food in the Last Year: Never true    Ran Out of Food in the Last Year: Never true  Transportation Needs: No Transportation Needs (04/24/2022)   PRAPARE - Administrator, Civil Service (Medical): No    Lack of Transportation (Non-Medical): No  Physical Activity: Insufficiently Active (04/24/2022)   Exercise Vital Sign    Days of Exercise per Week: 5 days    Minutes of Exercise per Session: 20 min  Stress: No Stress Concern Present (04/24/2022)   [employer] of Occupational Health - Occupational Stress Questionnaire    Feeling of Stress : Not at all  Social Connections: Moderately Isolated (04/24/2022)   Social Connection and Isolation Panel [NHANES]    Frequency of Communication with Friends and Family: Three times a week    Frequency of Social Gatherings with Friends and Family: Three times a week    Attends Religious Services: 1 to 4 times per year    Active Member of Clubs or Organizations: No    Attends Banker Meetings: Never    Marital Status: Widowed    Tobacco Counseling Counseling given: Not Answered   Clinical Intake:  Pre-visit preparation completed: Yes  Pain : No/denies pain     Nutritional Risks: None Diabetes: No  How often do you need to have someone help you when you read instructions, pamphlets, or other written materials from your doctor or pharmacy?: 1 - Never What is the last grade level you completed in school?: college  Diabetic?no   Interpreter Needed?: No  Information entered by :: L.Charnita Trudel,LPN   Activities of Daily Living    04/24/2022    1:52 PM  In your present state of health, do you have any difficulty performing the following activities:  Hearing? 0  Vision? 0  Difficulty concentrating or making decisions? 0  Walking or climbing stairs? 0  Dressing or  bathing? 0  Doing errands, shopping? 0  Preparing Food and eating ? N  Using the Toilet? N  In the past six months, have you accidently leaked urine? N  Do you have problems with loss of bowel control? N  Managing your Medications? N  Managing your Finances? N  Housekeeping or managing your Housekeeping? N    Patient Care Team: Janeece Agee, NP as PCP - General (Adult Health Nurse Practitioner)  Indicate any recent Medical Services you may have received from other than [facility] providers in the past year (date may be approximate).     Assessment:   This is a routine wellness examination for Mariah [name].  Hearing/Vision screen Vision Screening - Comments:: Annual eye exam wear glasses   Dietary issues and  exercise activities discussed: Current Exercise Habits: Home exercise routine, Type of exercise: walking, Time (Minutes): 20, Frequency (Times/Week): 5, Weekly Exercise (Minutes/Week): 100, Intensity: Mild, Exercise limited by: None identified   Goals Addressed   None    Depression Screen    04/24/2022    1:52 PM 03/25/2022   11:36 AM 09/27/2021    3:51 PM 09/27/2021    2:17 PM 03/25/2021   12:40 PM 10/15/2020   11:26 AM 02/22/2020    9:23 AM  PHQ 2/9 Scores  PHQ - 2 Score 0 0 1 0 0 0 0  PHQ- 9 Score  2 3        Fall Risk    04/24/2022    1:52 PM 10/09/2021   10:52 AM 05/28/2021   11:27 AM 03/25/2021   12:40 PM 10/15/2020   11:26 AM  Fall Risk   Falls in the past year? 0 0 0 0 0  Number falls in past yr: 0 0 0 0 0  Injury with Fall? 0 0 0 0 0  Risk for fall due to :  No Fall Risks No Fall Risks    Follow up Falls evaluation completed;Education provided Falls evaluation completed Falls evaluation completed Falls evaluation completed Falls evaluation completed    FALL RISK PREVENTION PERTAINING TO THE HOME:  Any stairs in or around the home? Yes  If so, are there any without handrails? No  Home free of loose throw rugs in walkways, pet beds, electrical cords, etc? Yes   Adequate lighting in your home to reduce risk of falls? Yes   ASSISTIVE DEVICES UTILIZED TO PREVENT FALLS:  Life alert? No  Use of a cane, walker or w/c? No  Grab bars in the bathroom? Yes  Shower chair or bench in shower? No  Elevated toilet seat or a handicapped toilet? No     Cognitive Function:    Normal cognitive status assessed by telephone conversation  by this Nurse Health Advisor. No abnormalities found.      12/07/2019   11:04 AM  6CIT Screen  What Year? 0 points  What month? 0 points  What time? 0 points  Count back from 20 0 points  Months in reverse 0 points  Repeat phrase 0 points  Total Score 0 points    Immunizations Immunization History  Administered Date(s) Administered   Influenza,inj,Quad PF,6+ Mos 09/20/2019, 09/27/2021   PFIZER(Purple Top)SARS-COV-2 Vaccination 01/20/2020, 02/10/2020   Zoster Recombinat (Shingrix) 03/25/2021, 05/29/2021    TDAP status: Due, Education has been provided regarding the importance of this vaccine. Advised may receive this vaccine at local pharmacy or Health Dept. Aware to provide a copy of the vaccination record if obtained from local pharmacy or Health Dept. Verbalized acceptance and understanding.  Flu Vaccine status: Declined, Education has been provided regarding the importance of this vaccine but patient still declined. Advised may receive this vaccine at local pharmacy or Health Dept. Aware to provide a copy of the vaccination record if obtained from local pharmacy or Health Dept. Verbalized acceptance and understanding.  Pneumococcal vaccine status: Due, Education has been provided regarding the importance of this vaccine. Advised may receive this vaccine at local pharmacy or Health Dept. Aware to provide a copy of the vaccination record if obtained from local pharmacy or Health Dept. Verbalized acceptance and understanding.  Covid-19 vaccine status: Completed vaccines  Qualifies for Shingles Vaccine? Yes    Zostavax completed Yes   Shingrix Completed?: Yes  Screening Tests Health Maintenance  Topic Date  Due   HIV Screening  Never done   Hepatitis C Screening  Never done   TETANUS/TDAP  Never done   MAMMOGRAM  08/28/2021   INFLUENZA VACCINE  06/10/2022   PAP SMEAR-Modifier  08/28/2022   COLONOSCOPY (Pts 45-44yrs Insurance coverage will need to be confirmed)  03/12/2026   Zoster Vaccines- Shingrix  Completed   HPV VACCINES  Aged Out   COVID-19 Vaccine  Discontinued    Health Maintenance  Health Maintenance Due  Topic Date Due   HIV Screening  Never done   Hepatitis C Screening  Never done   TETANUS/TDAP  Never done   MAMMOGRAM  08/28/2021    Colorectal cancer screening: Type of screening: Colonoscopy. Completed 03/12/2016. Repeat every 10 years  Mammogram status: [facility] [place] will call for report . Repeat every year  Bone Density status: Ordered not of age . Pt provided with contact info and advised to call to schedule appt.  Lung Cancer Screening: (Low Dose CT Chest recommended if Age 88-80 years, 30 pack-year currently smoking OR have quit w/in 15years.) does not qualify.   Lung Cancer Screening Referral: n/a  Additional Screening:  Hepatitis C Screening: does not qualify;   Vision Screening: Recommended annual ophthalmology exams for early detection of glaucoma and other disorders of the eye. Is the patient up to date with their annual eye exam?  Yes  Who is the provider or what is the name of the office in which the patient attends annual eye exams? Dr.Dunn  If pt is not established with a provider, would they like to be referred to a provider to establish care? No .   Dental Screening: Recommended annual dental exams for proper oral hygiene  Community Resource Referral / Chronic Care Management: CRR required this visit?  No   CCM required this visit?  No      Plan:     I have personally reviewed and noted the following in the  patient's chart:   Medical and social history Use of alcohol, tobacco or illicit drugs  Current medications and supplements including opioid prescriptions.  Functional ability and status Nutritional status Physical activity Advanced directives List of other physicians Hospitalizations, surgeries, and ER visits in previous 12 months Vitals Screenings to include cognitive, depression, and falls Referrals and appointments  In addition, I have reviewed and discussed with patient certain preventive protocols, quality metrics, and best practice recommendations. A written personalized care plan for preventive services as well as general preventive health recommendations were provided to patient.     March Rummage, LPN   [id number]   Nurse Notes: none

## 2022-04-24 NOTE — Patient Instructions (Signed)
Mariah [name] , Thank you for taking time to come for your Medicare Wellness Visit. I appreciate your ongoing commitment to your health goals. Please review the following plan we discussed and let me know if I can assist you in the future.   Screening recommendations/referrals: Colonoscopy: 03/12/2016 [facility] [place]  Bone Density: not of age  Recommended yearly ophthalmology/optometry visit for glaucoma screening and checkup Recommended yearly dental visit for hygiene and checkup  Vaccinations: Influenza vaccine: completed  Pneumococcal vaccine: due  Tdap vaccine: due  Shingles vaccine: Completed     Advanced directives: none   Conditions/risks identified: none   Next appointment: none    Preventive Care 65 Years and Older, Female Preventive care refers to lifestyle choices and visits with your health care provider that can promote health and wellness. What does preventive care include? A yearly physical exam. This is also called an annual well check. Dental exams once or twice a year. Routine eye exams. Ask your health care provider how often you should have your eyes checked. Personal lifestyle choices, including: Daily care of your teeth and gums. Regular physical activity. Eating a healthy diet. Avoiding tobacco and drug use. Limiting alcohol use. Practicing safe sex. Taking low-dose aspirin every day. Taking vitamin and mineral supplements as recommended by your health care provider. What happens during an annual well check? The services and screenings done by your health care provider during your annual well check will depend on your age, overall health, lifestyle risk factors, and family history of disease. Counseling  Your health care provider may ask you questions about your: Alcohol use. Tobacco use. Drug use. Emotional well-being. Home and relationship well-being. Sexual activity. Eating habits. History of falls. Memory and ability to  understand (cognition). Work and work Astronomer. Reproductive health. Screening  You may have the following tests or measurements: Height, weight, and BMI. Blood pressure. Lipid and cholesterol levels. These may be checked every 5 years, or more frequently if you are over 50 years old. Skin check. Lung cancer screening. You may have this screening every year starting at age 67 if you have a 30-pack-year history of smoking and currently smoke or have quit within the past 15 years. Fecal occult blood test (FOBT) of the stool. You may have this test every year starting at age 57. Flexible sigmoidoscopy or colonoscopy. You may have a sigmoidoscopy every 5 years or a colonoscopy every 10 years starting at age 61. Hepatitis C blood test. Hepatitis B blood test. Sexually transmitted disease (STD) testing. Diabetes screening. This is done by checking your blood sugar (glucose) after you have not eaten for a while (fasting). You may have this done every 1-3 years. Bone density scan. This is done to screen for osteoporosis. You may have this done starting at age 6. Mammogram. This may be done every 1-2 years. Talk to your health care provider about how often you should have regular mammograms. Talk with your health care provider about your test results, treatment options, and if necessary, the need for more tests. Vaccines  Your health care provider may recommend certain vaccines, such as: Influenza vaccine. This is recommended every year. Tetanus, diphtheria, and acellular pertussis (Tdap, Td) vaccine. You may need a Td booster every 10 years. Zoster vaccine. You may need this after age 50. Pneumococcal 13-valent conjugate (PCV13) vaccine. One dose is recommended after age 42. Pneumococcal polysaccharide (PPSV23) vaccine. One dose is recommended after age 69. Talk to your health care provider about which screenings  and vaccines you need and how often you need them. This information is not  intended to replace advice given to you by your health care provider. Make sure you discuss any questions you have with your health care provider. Document Released: 11/23/2015 Document Revised: 07/16/2016 Document Reviewed: 08/28/2015 Elsevier Interactive Patient Education  2017 [employer] Prevention in the Home Falls can cause injuries. They can happen to people of all ages. There are many things you can do to make your home safe and to help prevent falls. What can I do on the outside of my home? Regularly fix the edges of walkways and driveways and fix any cracks. Remove anything that might make you trip as you walk through a door, such as a raised step or threshold. Trim any bushes or trees on the path to your home. Use bright outdoor lighting. Clear any walking paths of anything that might make someone trip, such as rocks or tools. Regularly check to see if handrails are loose or broken. Make sure that both sides of any steps have handrails. Any raised decks and porches should have guardrails on the edges. Have any leaves, snow, or ice cleared regularly. Use sand or salt on walking paths during winter. Clean up any spills in your garage right away. This includes oil or grease spills. What can I do in the bathroom? Use night lights. Install grab bars by the toilet and in the tub and shower. Do not use towel bars as grab bars. Use non-skid mats or decals in the tub or shower. If you need to sit down in the shower, use a plastic, non-slip stool. Keep the floor dry. Clean up any water that spills on the floor as soon as it happens. Remove soap buildup in the tub or shower regularly. Attach bath mats securely with double-sided non-slip rug tape. Do not have throw rugs and other things on the floor that can make you trip. What can I do in the bedroom? Use night lights. Make sure that you have a light by your bed that is easy to reach. Do not use any sheets or blankets that are  too big for your bed. They should not hang down onto the floor. Have a firm chair that has side arms. You can use this for support while you get dressed. Do not have throw rugs and other things on the floor that can make you trip. What can I do in the kitchen? Clean up any spills right away. Avoid walking on wet floors. Keep items that you use a lot in easy-to-reach places. If you need to reach something above you, use a strong step stool that has a grab bar. Keep electrical cords out of the way. Do not use floor polish or wax that makes floors slippery. If you must use wax, use non-skid floor wax. Do not have throw rugs and other things on the floor that can make you trip. What can I do with my stairs? Do not leave any items on the stairs. Make sure that there are handrails on both sides of the stairs and use them. Fix handrails that are broken or loose. Make sure that handrails are as long as the stairways. Check any carpeting to make sure that it is firmly attached to the stairs. Fix any carpet that is loose or worn. Avoid having throw rugs at the top or bottom of the stairs. If you do have throw rugs, attach them to the floor with carpet tape.  Make sure that you have a light switch at the top of the stairs and the bottom of the stairs. If you do not have them, ask someone to add them for you. What else can I do to help prevent falls? Wear shoes that: Do not have high heels. Have rubber bottoms. Are comfortable and fit you well. Are closed at the toe. Do not wear sandals. If you use a stepladder: Make sure that it is fully opened. Do not climb a closed stepladder. Make sure that both sides of the stepladder are locked into place. Ask someone to hold it for you, if possible. Clearly mark and make sure that you can see: Any grab bars or handrails. First and last steps. Where the edge of each step is. Use tools that help you move around (mobility aids) if they are needed. These  include: Canes. Walkers. Scooters. Crutches. Turn on the lights when you go into a dark area. Replace any light bulbs as soon as they burn out. Set up your furniture so you have a clear path. Avoid moving your furniture around. If any of your floors are uneven, fix them. If there are any pets around you, be aware of where they are. Review your medicines with your doctor. Some medicines can make you feel dizzy. This can increase your chance of falling. Ask your doctor what other things that you can do to help prevent falls. This information is not intended to replace advice given to you by your health care provider. Make sure you discuss any questions you have with your health care provider. Document Released: 08/23/2009 Document Revised: 04/03/2016 Document Reviewed: 12/01/2014 Elsevier Interactive Patient Education  2017 [employer].

## 2022-05-12 ENCOUNTER — Other Ambulatory Visit: Payer: Self-pay | Admitting: Registered Nurse

## 2022-05-12 MED ORDER — SEMAGLUTIDE (1 MG/DOSE) 4 MG/3ML ~~LOC~~ SOPN: 1.0000 mg | PEN_INJECTOR | SUBCUTANEOUS | 0 refills | Status: DC

## 2022-05-14 ENCOUNTER — Other Ambulatory Visit: Payer: Self-pay | Admitting: Registered Nurse

## 2022-05-14 MED ORDER — SEMAGLUTIDE (1 MG/DOSE) 4 MG/3ML ~~LOC~~ SOPN: 1.0000 mg | PEN_INJECTOR | SUBCUTANEOUS | 0 refills | Status: DC

## 2022-05-26 ENCOUNTER — Other Ambulatory Visit: Payer: Self-pay | Admitting: Registered Nurse

## 2022-05-26 MED ORDER — SEMAGLUTIDE (1 MG/DOSE) 4 MG/3ML ~~LOC~~ SOPN: 1.0000 mg | PEN_INJECTOR | SUBCUTANEOUS | 0 refills | Status: DC

## 2022-06-02 ENCOUNTER — Other Ambulatory Visit: Payer: Self-pay | Admitting: Registered Nurse

## 2022-06-13 ENCOUNTER — Ambulatory Visit: Payer: Self-pay

## 2022-06-13 NOTE — Patient Outreach (Signed)
  Care Coordination   06/13/2022 Name: Mariah [name] MRN: [id number] DOB: 11-11-1955   Care Coordination Outreach Attempts:  An unsuccessful telephone outreach was attempted today to offer the patient information about available care coordination services as a benefit of their health plan.   Follow Up Plan:  Additional outreach attempts will be made to offer the patient care coordination information and services.   Encounter Outcome:  No Answer  Care Coordination Interventions Activated:  No   Care Coordination Interventions:  No, not indicated    Bevelyn Ngo, BSW, CDP Social Worker, Certified Dementia Practitioner Care Coordination [phone]

## 2022-06-25 ENCOUNTER — Ambulatory Visit: Payer: Medicare HMO | Admitting: Registered Nurse

## 2022-06-26 ENCOUNTER — Ambulatory Visit: Payer: Medicare HMO | Admitting: Family Medicine

## 2022-07-08 NOTE — Progress Notes (Signed)
[facility])                                            [facility] Quality Pharmacy Team                                        Statin Quality Measure Assessment    07/08/2022  Mariah [name] [birth year] [id number]  Per review of chart and payor information, this patient has been flagged for non-adherence to the following CMS Quality Measure:   [x]  Statin Use in Persons with Diabetes  []  Statin Use in Persons with Cardiovascular Disease  The 10-year ASCVD risk score (Arnett DK, et al., 2019) is: 11.1%   Values used to calculate the score:     Age: 66 years     Sex: Female     Is Non-Hispanic African American: Yes     Diabetic: No     Tobacco smoker: No     Systolic Blood Pressure: 146 mmHg     Is BP treated: Yes     HDL Cholesterol: 53 mg/dL     Total Cholesterol: 184 mg/dL  This patient is failing SUPD CMS measure. Hemoglobin A1c 5.7-6.4% and she is on Ozempic. Next appointment is with Dr. 2020 on 07/09/2022. If deemed clinically appropriate, please consider associating exclusion code (see options below) or assessing statin therapy (with updated labs).   Please consider ONE of the following recommendations:     Initiate high intensity statin Atorvastatin 40mg  once daily, #90, 3 refills   Rosuvastatin 20mg  once daily, #90, 3 refills    Initiate moderate intensity          statin with reduced frequency if prior          statin intolerance 1x weekly, #13, 3 refills   2x weekly, #26, 3 refills   3x weekly, #39, 3 refills   Code for past statin intolerance or other exclusions (required annually)  Drug Induced Myopathy G72.0   Myositis, unspecified M60.9   Rhabdomyolysis M62.82   Myalgia - for [facility] ONLY  & will not close SUPD measure M79.1   Cirrhosis of liver K74.69   Biliary cirrhosis, unspecified K74.5   Abnormal blood glucose - for SUPD ONLY R73.09   Prediabetes - for SUPD ONLY  R73.03   Polycystic ovarian syndrome E28.2        Thank you for your time,  07/11/2022, PharmD Clinical Pharmacist [facility] Cell: [phone]

## 2022-07-09 ENCOUNTER — Encounter: Payer: Self-pay | Admitting: Family Medicine

## 2022-07-09 ENCOUNTER — Ambulatory Visit (INDEPENDENT_AMBULATORY_CARE_PROVIDER_SITE_OTHER): Payer: Medicare HMO | Admitting: Family Medicine

## 2022-07-09 VITALS — BP 138/78 | HR 67 | Temp 97.8°F | Ht 67.0 in | Wt 307.4 lb

## 2022-07-09 DIAGNOSIS — R7303 Prediabetes: Secondary | ICD-10-CM | POA: Diagnosis not present

## 2022-07-09 DIAGNOSIS — M79661 Pain in right lower leg: Secondary | ICD-10-CM

## 2022-07-09 DIAGNOSIS — R600 Localized edema: Secondary | ICD-10-CM

## 2022-07-09 DIAGNOSIS — M25512 Pain in left shoulder: Secondary | ICD-10-CM | POA: Diagnosis not present

## 2022-07-09 DIAGNOSIS — Z6841 Body Mass Index (BMI) 40.0 and over, adult: Secondary | ICD-10-CM

## 2022-07-09 DIAGNOSIS — I10 Essential (primary) hypertension: Secondary | ICD-10-CM

## 2022-07-09 DIAGNOSIS — Z13220 Encounter for screening for lipoid disorders: Secondary | ICD-10-CM | POA: Diagnosis not present

## 2022-07-09 DIAGNOSIS — E876 Hypokalemia: Secondary | ICD-10-CM

## 2022-07-09 LAB — LIPID PANEL
Cholesterol: 209 mg/dL — ABNORMAL HIGH (ref 0–200)
HDL: 60.8 mg/dL (ref >39.00)
LDL Cholesterol: 128 mg/dL — ABNORMAL HIGH (ref 0–99)
NonHDL: 147.98
Total CHOL/HDL Ratio: 3
Triglycerides: 101 mg/dL (ref 0.0–149.0)
VLDL: 20.2 mg/dL (ref 0.0–40.0)

## 2022-07-09 LAB — HEMOGLOBIN A1C: Hgb A1c MFr Bld: 6 % (ref 4.6–6.5)

## 2022-07-09 LAB — COMPREHENSIVE METABOLIC PANEL
ALT: 13 U/L (ref 0–35)
AST: 21 U/L (ref 0–37)
Albumin: 3.9 g/dL (ref 3.5–5.2)
Alkaline Phosphatase: 59 U/L (ref 39–117)
BUN: 14 mg/dL (ref 6–23)
CO2: 29 mEq/L (ref 19–32)
Calcium: 10 mg/dL (ref 8.4–10.5)
Chloride: 100 mEq/L (ref 96–112)
Creatinine, Ser: 0.81 mg/dL (ref 0.40–1.20)
GFR: 76.6 mL/min (ref >60.00)
Glucose, Bld: 81 mg/dL (ref 70–99)
Potassium: 3.5 mEq/L (ref 3.5–5.1)
Sodium: 139 mEq/L (ref 135–145)
Total Bilirubin: 0.4 mg/dL (ref 0.2–1.2)
Total Protein: 8.2 g/dL (ref 6.0–8.3)

## 2022-07-09 MED ORDER — VALSARTAN 320 MG PO TABS: 320.0000 mg | ORAL_TABLET | Freq: Every day | ORAL | 1 refills | Status: DC

## 2022-07-09 MED ORDER — POTASSIUM CHLORIDE CRYS ER 20 MEQ PO TBCR: 20.0000 meq | EXTENDED_RELEASE_TABLET | Freq: Every day | ORAL | 3 refills | Status: AC

## 2022-07-09 MED ORDER — FUROSEMIDE 20 MG PO TABS: 20.0000 mg | ORAL_TABLET | Freq: Every day | ORAL | 1 refills | Status: DC

## 2022-07-09 MED ORDER — CHLORTHALIDONE 50 MG PO TABS: 50.0000 mg | ORAL_TABLET | Freq: Every day | ORAL | 1 refills | Status: AC

## 2022-07-09 MED ORDER — METOPROLOL SUCCINATE ER 100 MG PO TB24: ORAL_TABLET | ORAL | 1 refills | Status: AC

## 2022-07-09 NOTE — Progress Notes (Signed)
Subjective:  Patient ID: Mariah [name], female    DOB: Sep 19, 1956  Age: 66 y.o. MRN: [id number]  CC:  Chief Complaint  Patient presents with   Hypertension    Pt states all is well, pt states arm is still soar from shingles shot she had months ago, pt is also fasting    HPI Mariah [name] presents for  Medication refill, previous patient of Janeece Agee, NP.  Will be establishing with new provider. No change in health since last visit.   Hypertension: Chlorthalidone 50 mg daily, furosemide 20 mg daily, Toprol-XL 100 mg daily, valsartan 320 mg daily, potassium 20 mill equivalents daily.  Last seen in May by Richard with some swelling at that time.  Thought to be related to remote ankle injury versus vascular issue and recommended compression stockings, continued weight loss. No new side effects with meds. Due for labs  Fasting today.  Home readings: 128/80.  BP Readings from Last 3 Encounters:  07/09/22 138/78  03/25/22 (!) 142/82  09/27/21 (!) 146/92   Lab Results  Component Value Date   CREATININE 0.87 09/27/2021   Obesity Treated with semaglutide.  Lifestyle management also discussed at her May visit.  Semaglutide was increased to 1 mg at that time. Tolerating 1mg  dose, no n/v.  No regular sugar beverages.  Fast food 2 days per week.  Exercise: leg exercises with bands. Minimal treadmill. Arthritis in knees limiting.    R leg swelling. R knee swells at times. Chronic R ankle issues.  Persistent R leg swelling- past year. Some calf soreness at times.  Saw vascular in 07/2021. Venous reflux noted bilaterally.  Intermittent use of compression stockings. Hard to get off.  No CP/dyspnea.    Lab Results  Component Value Date   CHOL 184 09/27/2021   HDL 53 09/27/2021   LDLCALC 108 (H) 09/27/2021   TRIG 121 09/27/2021   CHOLHDL 3.5 09/27/2021     Left shoulder pain: Noticed past month - shingrix last year. Covid vaccine last year.  No injury. Outside of left  shoulder. Outside of left shoulder, not in bones.  No new activity.  Tx: none. Has muscle rub for knees.  Immunization History  Administered Date(s) Administered   Influenza,inj,Quad PF,6+ Mos 09/20/2019, 09/27/2021   PFIZER(Purple Top)SARS-COV-2 Vaccination 01/20/2020, 02/10/2020   Zoster Recombinat (Shingrix) 03/25/2021, 05/29/2021    Prediabetes: Attempting weight loss as above including with medication.  Last A1c of last year.  Lab Results  Component Value Date   HGBA1C 5.7 (H) 09/27/2021   Wt Readings from Last 3 Encounters:  07/09/22 (!) 307 lb 6.4 oz (139.4 kg)  03/25/22 (!) 307 lb 3.2 oz (139.3 kg)  09/27/21 (!) 314 lb (142.4 kg)     History Patient Active Problem List   Diagnosis Date Noted   Right leg swelling 03/25/2022   Arthritis 08/29/2019   Morbid obesity with body mass index (BMI) greater than or equal to 50 (HCC) 08/29/2019   Hypertensive disorder 06/20/2019   Past Medical History:  Diagnosis Date   Arthritis    oa   Headache    Hypertension    Shortness of breath dyspnea    with exertion   Past Surgical History:  Procedure Laterality Date   COLONOSCOPY WITH PROPOFOL N/A 03/12/2016   Procedure: COLONOSCOPY WITH PROPOFOL;  Surgeon: 05/12/2016, MD;  Location: [facility];  Service: Endoscopy;  Laterality: N/A;   NO PAST SURGERIES     Allergies  Allergen Reactions   Penicillins Shortness Of  Breath, Swelling and Other (See Comments)    Has patient had a PCN reaction causing immediate rash, facial/tongue/throat swelling, SOB or lightheadedness with hypotension: yes Has patient had a PCN reaction causing severe rash involving mucus membranes or skin necrosis: no Has patient had a PCN reaction that required hospitalization no Has patient had a PCN reaction occurring within the last 10 years: no If all of the above answers are "NO", then may proceed with Cephalosporin use.    Prior to Admission medications   Medication Sig Start Date End Date  Taking? Authorizing Provider  CALCIUM CARBONATE PO Take 1 tablet by mouth daily.   Yes [provider]  chlorthalidone (HYGROTON) 50 MG tablet TAKE 1 TABLET EVERY DAY 03/06/22  Yes Janeece Agee, NP  Cholecalciferol (VITAMIN D3 PO) Take 1 tablet by mouth daily.   Yes [provider]  furosemide (LASIX) 20 MG tablet TAKE 1 TABLET EVERY DAY 02/14/22  Yes Janeece Agee, NP  metoprolol succinate (TOPROL-XL) 100 MG 24 hr tablet TAKE 1 TABLET DAILY WITH OR IMMEDIATELY FOLLOWING A MEAL. 03/06/22  Yes Janeece Agee, NP  Multiple Vitamin (MULTIVITAMIN WITH MINERALS) TABS tablet Take 1 tablet by mouth daily.   Yes [provider]  OZEMPIC, 1 MG/DOSE, 4 MG/3ML SOPN INJECT 1MG  UNDER THE SKIN ONE TIME WEEKLY 06/02/22  Yes 06/04/22, NP  potassium chloride SA (KLOR-CON) 20 MEQ tablet Take 1 tablet (20 mEq total) by mouth daily. 09/27/21  Yes 09/29/21, NP  valsartan (DIOVAN) 320 MG tablet TAKE 1 TABLET (320 MG TOTAL) DAILY. 12/09/21  Yes 12/11/21, NP   Social History   Socioeconomic History   Marital status: Widowed    Spouse name: Not on file   Number of children: Not on file   Years of education: Not on file   Highest education level: Not on file  Occupational History   Not on file  Tobacco Use   Smoking status: Never   Smokeless tobacco: Never  Vaping Use   Vaping Use: Never used  Substance and Sexual Activity   Alcohol use: Yes    Comment: occc wine   Drug use: No   Sexual activity: Not Currently  Other Topics Concern   Not on file  Social History Narrative   Not on file   Social Determinants of Health   Financial Resource Strain: Low Risk  (04/24/2022)   Overall Financial Resource Strain (CARDIA)    Difficulty of Paying Living Expenses: Not hard at all  Food Insecurity: No Food Insecurity (04/24/2022)   Hunger Vital Sign    Worried About Running Out of Food in the Last Year: Never true    Ran Out of Food in the Last Year: Never true   Transportation Needs: No Transportation Needs (04/24/2022)   PRAPARE - 04/26/2022 (Medical): No    Lack of Transportation (Non-Medical): No  Physical Activity: Insufficiently Active (04/24/2022)   Exercise Vital Sign    Days of Exercise per Week: 5 days    Minutes of Exercise per Session: 20 min  Stress: No Stress Concern Present (04/24/2022)   04/26/2022 of Occupational Health - Occupational Stress Questionnaire    Feeling of Stress : Not at all  Social Connections: Moderately Isolated (04/24/2022)   Social Connection and Isolation Panel [NHANES]    Frequency of Communication with Friends and Family: Three times a week    Frequency of Social Gatherings with Friends and Family: Three times a week  Attends Religious Services: 1 to 4 times per year    Active Member of Clubs or Organizations: No    Attends Banker Meetings: Never    Marital Status: Widowed  Intimate Partner Violence: Not At Risk (04/24/2022)   Humiliation, Afraid, Rape, and Kick questionnaire    Fear of Current or Ex-Partner: No    Emotionally Abused: No    Physically Abused: No    Sexually Abused: No    Review of Systems  Constitutional:  Negative for fatigue and unexpected weight change.  Respiratory:  Negative for chest tightness and shortness of breath.   Cardiovascular:  Negative for chest pain, palpitations and leg swelling.  Gastrointestinal:  Negative for abdominal pain and blood in stool.  Neurological:  Negative for dizziness, syncope, light-headedness and headaches.     Objective:   Vitals:   07/09/22 1049  BP: 138/78  Pulse: 67  Temp: 97.8 F (36.6 C)  SpO2: 97%  Weight: (!) 307 lb 6.4 oz (139.4 kg)  Height: 5\' 7"  (1.702 m)     Physical Exam Vitals reviewed.  Constitutional:      Appearance: Normal appearance. She is well-developed.  HENT:     Head: Normocephalic and atraumatic.  Eyes:     Conjunctiva/sclera: Conjunctivae normal.      Pupils: Pupils are equal, round, and reactive to light.  Neck:     Vascular: No carotid bruit.  Cardiovascular:     Rate and Rhythm: Normal rate and regular rhythm.     Heart sounds: Normal heart sounds.  Pulmonary:     Effort: Pulmonary effort is normal.     Breath sounds: Normal breath sounds.  Abdominal:     Palpations: Abdomen is soft. There is no pulsatile mass.     Tenderness: There is no abdominal tenderness.  Musculoskeletal:     Right lower leg: No edema.     Left lower leg: No edema.     Comments: Asymmetric edema of right lower extremity, with 1+ pitting, mid tibia.  Minimal discomfort at the posterior upper calf but no cords palpated.  Negative Homans.  Trace pedal edema on left.  Skin:    General: Skin is warm and dry.  Neurological:     Mental Status: She is alert and oriented to person, place, and time.  Psychiatric:        Mood and Affect: Mood normal.        Behavior: Behavior normal.        Assessment & Plan:  Mariah [name] is a 67 y.o. female . Essential hypertension - Plan: chlorthalidone (HYGROTON) 50 MG tablet, Comprehensive metabolic panel, valsartan (DIOVAN) 320 MG tablet, Lipid panel, metoprolol succinate (TOPROL-XL) 100 MG 24 hr tablet  -Overall stable, continue same regimen.  Edema of right lower extremity - Plan: furosemide (LASIX) 20 MG tablet, 77 Venous Img Lower Unilateral Right (DVT)  -Venous reflux likely cause but with asymmetry and new discomfort check ultrasound to rule out DVT although unlikely..  Compression stockings discussed.  Continue furosemide.  Check labs.  Class 3 severe obesity with serious comorbidity and body mass index (BMI) of 45.0 to 49.9 in adult, unspecified obesity type (HCC) Prediabetes - Plan: Lipid panel, Hemoglobin A1c  -Continue Ozempic, check A1c.  Right calf pain - Plan: US Venous Img Lower Unilateral Right (DVT)  -Above, check ultrasound, compression stockings.  Consider follow-up with vein specialist if  new/worsening symptoms.  Acute pain of left shoulder  -Superficial pain, possible deltoid discomfort.  Unlikely  related to injections due to timing of symptoms.  Topical treatment with muscle rub, Biofreeze, RTC precautions if persistent.  Screening for hyperlipidemia - Plan: Lipid panel  Morbid obesity with body mass index (BMI) greater than or equal to 50 (HCC)  -Continue Ozempic as above, watch diet, activity as tolerated/able with her knee issues.  Hypokalemia - Plan: potassium chloride SA (KLOR-CON M) 20 MEQ tablet  Check labs as above, continue potassium.  Meds ordered this encounter  Medications   chlorthalidone (HYGROTON) 50 MG tablet    Sig: Take 1 tablet (50 mg total) by mouth daily.    Dispense:  90 tablet    Refill:  1   furosemide (LASIX) 20 MG tablet    Sig: Take 1 tablet (20 mg total) by mouth daily.    Dispense:  90 tablet    Refill:  1   valsartan (DIOVAN) 320 MG tablet    Sig: Take 1 tablet (320 mg total) by mouth daily.    Dispense:  90 tablet    Refill:  1   metoprolol succinate (TOPROL-XL) 100 MG 24 hr tablet    Sig: TAKE 1 TABLET DAILY WITH OR IMMEDIATELY FOLLOWING A MEAL.    Dispense:  90 tablet    Refill:  1   potassium chloride SA (KLOR-CON M) 20 MEQ tablet    Sig: Take 1 tablet (20 mEq total) by mouth daily.    Dispense:  90 tablet    Refill:  3   Patient Instructions  For shoulder pain, try biofreeze or other topical muscle rub for now. If not improving in next few weeks, return to discuss further.  I will order ultrasound of leg, but try to use compression stockings - medical supply store may be able to find more comfortable ones.  No med changes for now.   Please establish with new primary care provider as Gerlene Burdock has left.  Please let us know if there are any acute concerns or something we can help with in the meantime.  Take care!      Signed,   Meredith Staggers, MD [facility], [facility] 07/09/22 12:00 PM

## 2022-07-09 NOTE — Patient Instructions (Addendum)
For shoulder pain, try biofreeze or other topical muscle rub for now. If not improving in next few weeks, return to discuss further.  I will order ultrasound of leg, but try to use compression stockings - medical supply store may be able to find more comfortable ones.  No med changes for now.   Please establish with new primary care provider as Gerlene Burdock has left.  Please let us know if there are any acute concerns or something we can help with in the meantime.  Take care!

## 2022-07-11 ENCOUNTER — Other Ambulatory Visit: Payer: Self-pay

## 2022-07-17 ENCOUNTER — Telehealth: Payer: Self-pay

## 2022-07-17 NOTE — Patient Outreach (Signed)
  Care Coordination   07/17/2022 Name: Mariah [name] MRN: [id number] DOB: Aug 19, 1956   Care Coordination Outreach Attempts:  A second unsuccessful outreach was attempted today to offer the patient with information about available care coordination services as a benefit of their health plan.     Follow Up Plan:  Additional outreach attempts will be made to offer the patient care coordination information and services.   Encounter Outcome:  No Answer  Care Coordination Interventions Activated:  No   Care Coordination Interventions:  No, not indicated    Bevelyn Ngo, BSW, CDP Social Worker, Certified Dementia Practitioner Care Coordination [phone]

## 2022-07-29 ENCOUNTER — Encounter: Payer: Self-pay | Admitting: Family Medicine

## 2022-07-29 ENCOUNTER — Other Ambulatory Visit: Payer: Self-pay

## 2022-07-29 MED ORDER — OZEMPIC (1 MG/DOSE) 4 MG/3ML ~~LOC~~ SOPN: PEN_INJECTOR | SUBCUTANEOUS | 3 refills | Status: AC

## 2022-08-25 ENCOUNTER — Ambulatory Visit: Payer: Self-pay

## 2022-08-25 NOTE — Patient Outreach (Signed)
  Care Coordination   08/25/2022 Name: Mariah [name] MRN: [id number] DOB: 1956-04-16   Care Coordination Outreach Attempts:  A third unsuccessful outreach was attempted today to offer the patient with information about available care coordination services as a benefit of their health plan.   Follow Up Plan:  No further outreach attempts will be made at this time. We have been unable to contact the patient to offer or enroll patient in care coordination services  Encounter Outcome:  No Answer  Care Coordination Interventions Activated:  No   Care Coordination Interventions:  No, not indicated    Daneen Schick, BSW, CDP Social Worker, Certified Dementia Practitioner [place] Coordination [phone]

## 2022-09-19 ENCOUNTER — Ambulatory Visit (HOSPITAL_BASED_OUTPATIENT_CLINIC_OR_DEPARTMENT_OTHER)
Admission: RE | Admit: 2022-09-19 | Discharge: 2022-09-19 | Disposition: A | Discharge status: Home / Self Care | Payer: Medicare HMO | Source: Ambulatory Visit | Attending: Family Medicine | Admitting: Family Medicine

## 2022-09-19 DIAGNOSIS — M79661 Pain in right lower leg: Secondary | ICD-10-CM | POA: Diagnosis not present

## 2022-09-19 DIAGNOSIS — R600 Localized edema: Secondary | ICD-10-CM | POA: Diagnosis not present

## 2022-09-19 DIAGNOSIS — M79604 Pain in right leg: Secondary | ICD-10-CM | POA: Diagnosis not present

## 2022-09-23 DIAGNOSIS — H35021 Exudative retinopathy, right eye: Secondary | ICD-10-CM | POA: Diagnosis not present

## 2022-09-23 DIAGNOSIS — E1136 Type 2 diabetes mellitus with diabetic cataract: Secondary | ICD-10-CM | POA: Diagnosis not present

## 2022-09-23 LAB — HM DIABETES EYE EXAM

## 2022-10-09 DIAGNOSIS — Z1231 Encounter for screening mammogram for malignant neoplasm of breast: Secondary | ICD-10-CM | POA: Diagnosis not present

## 2022-10-09 DIAGNOSIS — Z01419 Encounter for gynecological examination (general) (routine) without abnormal findings: Secondary | ICD-10-CM | POA: Diagnosis not present

## 2022-10-09 DIAGNOSIS — Z6841 Body Mass Index (BMI) 40.0 and over, adult: Secondary | ICD-10-CM | POA: Diagnosis not present

## 2022-10-15 ENCOUNTER — Ambulatory Visit: Payer: Medicare HMO | Admitting: Family Medicine

## 2022-11-23 ENCOUNTER — Other Ambulatory Visit: Payer: Self-pay | Admitting: Family Medicine

## 2022-11-23 DIAGNOSIS — R600 Localized edema: Secondary | ICD-10-CM

## 2022-12-04 ENCOUNTER — Other Ambulatory Visit: Payer: Self-pay | Admitting: Family Medicine

## 2022-12-04 DIAGNOSIS — Z13820 Encounter for screening for osteoporosis: Secondary | ICD-10-CM

## 2022-12-09 ENCOUNTER — Ambulatory Visit
Admission: RE | Admit: 2022-12-09 | Discharge: 2022-12-09 | Disposition: A | Discharge status: Home / Self Care | Payer: Medicare HMO | Source: Ambulatory Visit | Attending: Family Medicine | Admitting: Family Medicine

## 2022-12-09 DIAGNOSIS — Z13820 Encounter for screening for osteoporosis: Secondary | ICD-10-CM

## 2022-12-18 ENCOUNTER — Other Ambulatory Visit: Payer: Self-pay | Admitting: Family Medicine

## 2022-12-18 DIAGNOSIS — I10 Essential (primary) hypertension: Secondary | ICD-10-CM

## 2023-02-08 ENCOUNTER — Other Ambulatory Visit: Payer: Self-pay | Admitting: Family Medicine

## 2023-02-08 DIAGNOSIS — I10 Essential (primary) hypertension: Secondary | ICD-10-CM

## 2023-02-09 ENCOUNTER — Other Ambulatory Visit: Payer: Self-pay

## 2023-02-09 DIAGNOSIS — I10 Essential (primary) hypertension: Secondary | ICD-10-CM

## 2023-02-09 MED ORDER — VALSARTAN 320 MG PO TABS: 320.0000 mg | ORAL_TABLET | Freq: Every day | ORAL | 0 refills | Status: DC

## 2023-03-02 ENCOUNTER — Other Ambulatory Visit: Payer: Self-pay | Admitting: Family Medicine

## 2023-03-02 DIAGNOSIS — I10 Essential (primary) hypertension: Secondary | ICD-10-CM

## 2023-05-26 ENCOUNTER — Other Ambulatory Visit: Payer: Self-pay | Admitting: Family Medicine

## 2023-05-26 DIAGNOSIS — Z1231 Encounter for screening mammogram for malignant neoplasm of breast: Secondary | ICD-10-CM

## 2023-06-06 ENCOUNTER — Other Ambulatory Visit: Payer: Self-pay | Admitting: Family Medicine

## 2023-06-06 DIAGNOSIS — E876 Hypokalemia: Secondary | ICD-10-CM

## 2023-09-09 ENCOUNTER — Other Ambulatory Visit: Payer: Self-pay | Admitting: Family Medicine

## 2023-09-09 DIAGNOSIS — R600 Localized edema: Secondary | ICD-10-CM

## 2023-10-12 ENCOUNTER — Ambulatory Visit: Payer: Medicare HMO

## 2023-10-13 NOTE — H&P (Unsigned)
NAMEABERDEEN, MESCALL MEDICAL RECORD NO: [id number] ACCOUNT NO: [id number] DATE OF BIRTH: April 26, 1956 PHYSICIAN: Duke Salvia. Marcelle Overlie, MD  History and Physical   DATE OF ADMISSION: 10/22/2023  Date of scheduled surgery at [facility] is 10/22/2023.  CHIEF COMPLAINT:  Abnormal uterine bleeding.  HISTORY OF PRESENT ILLNESS:  A 67 year old widowed G3, P3, was seen recently with complaints of some postmenopausal bleeding.  Sonohysterogram was performed documenting a well-defined polyp.  She presents at this time for dilation and curettage with  hysteroscopy with possible MyoSure resection.  This procedure including specific risks regarding bleeding, infection, adjacent organ injury, or other complications that may require additional surgery discussed with her, which she understands and accepts.  PAST MEDICAL HISTORY: ALLERGIES:  PENICILLIN.  CURRENT MEDICATIONS:  Atorvastatin 10 mg daily, chlorthalidone 50 mg daily, furosemide 20 mg daily, metoprolol ER 100 mg extended-release daily, Naprosyn as needed, Ozempic injector per protocol, KCl, valsartan 320 mg once daily.  FAMILY HISTORY:  Significant for granddaughter with diabetes.  Her mother, father, brother, and sister all have hypertension.  Brother had colon cancer in his 34s and her mother and grandfather both have RA.  SOCIAL HISTORY:  She is widowed, not currently sexually active.  She is a never smoker.  Occasional alcohol use.  REVIEW OF SYSTEMS:  Her problem list includes arthritis, hypertension, prediabetes and obesity.  PHYSICAL EXAMINATION: VITAL SIGNS:  Blood pressure 140/90, weight 306. HEENT:  Unremarkable. NECK:  Supple without masses. LUNGS:  Clear. CARDIOVASCULAR:  Regular rate and rhythm without murmurs, rubs or gallops. BREASTS:  Without masses. ABDOMEN:  Soft, flat, and nontender. PELVIC:  Vulva, vagina, cervix normal.  Uterus in smooth position, normal size.  Adnexa negative.  Exam difficult due to her  weight. EXTREMITIES:  Unremarkable. NEUROLOGIC:  Unremarkable.  IMPRESSION:  Postmenopausal bleeding, endometrial polyp.  PLAN:  Dilation and curettage with hysteroscopy, possible MyoSure.  Procedure and risks reviewed as above.   PUS D: 10/12/2023 2:40:10 pm T: 10/12/2023 7:17:00 pm  JOB: [id number]/ [id number]

## 2023-10-19 ENCOUNTER — Encounter (HOSPITAL_BASED_OUTPATIENT_CLINIC_OR_DEPARTMENT_OTHER): Payer: Self-pay | Admitting: Obstetrics and Gynecology

## 2023-10-20 ENCOUNTER — Encounter (HOSPITAL_BASED_OUTPATIENT_CLINIC_OR_DEPARTMENT_OTHER): Payer: Self-pay | Admitting: Obstetrics and Gynecology

## 2023-10-20 ENCOUNTER — Other Ambulatory Visit: Payer: Self-pay

## 2023-10-20 NOTE — Progress Notes (Addendum)
Spoke w/ via phone for pre-op interview---Mariah [name] needs dos----    cbc, type & screen, ISTAT, ekg   [name] results------none COVID test -----patient states asymptomatic no test needed Arrive at -------0530 on Thursday, 10/22/2023 NPO after MN NO Solid Food.  Clear liquids from MN until---0430 Med rec completed Medications to take morning of surgery -----Metoprolol Diabetic medication -----Hold Ozempic x 7 days. Per patient last dose before surgery was on 10/13/23. Patient instructed no nail polish to be worn day of surgery Patient instructed to bring photo id and insurance card day of surgery Patient aware to have Driver (ride ) / caregiver    for 24 hours after surgery - daughter, Mariah [name] Patient Special Instructions -----none Pre-Op special Instructions -----note Patient verbalized understanding of instructions that were given at this phone interview. Patient denies chest pain, sob, fever, cough at the interview.   **During pre-op phone call, patient stated that her PCP, Dr. Normand Sloop told her that due to her medical conditions she should not receive general anesthesia ( hx of HTN, prediabetes, obesity.) However, I do not see this mentioned in the LOV notes from 10/01/23 & 08/10/23  included in chart. I instructed patient that she would be speaking with anesthesia before surgery and she could discuss at that time.**

## 2023-10-22 ENCOUNTER — Ambulatory Visit (HOSPITAL_BASED_OUTPATIENT_CLINIC_OR_DEPARTMENT_OTHER): Payer: Medicare HMO | Admitting: Anesthesiology

## 2023-10-22 ENCOUNTER — Other Ambulatory Visit: Payer: Self-pay

## 2023-10-22 ENCOUNTER — Encounter (HOSPITAL_BASED_OUTPATIENT_CLINIC_OR_DEPARTMENT_OTHER)
Admission: RE | Disposition: A | Discharge status: Home / Self Care | Payer: Self-pay | Source: Home / Self Care | Attending: Obstetrics and Gynecology

## 2023-10-22 ENCOUNTER — Encounter (HOSPITAL_BASED_OUTPATIENT_CLINIC_OR_DEPARTMENT_OTHER): Payer: Self-pay | Admitting: Obstetrics and Gynecology

## 2023-10-22 ENCOUNTER — Ambulatory Visit (HOSPITAL_BASED_OUTPATIENT_CLINIC_OR_DEPARTMENT_OTHER)
Admission: RE | Admit: 2023-10-22 | Discharge: 2023-10-22 | Disposition: A | Discharge status: Home / Self Care | Payer: Medicare HMO | Attending: Obstetrics and Gynecology | Admitting: Obstetrics and Gynecology

## 2023-10-22 DIAGNOSIS — N950 Postmenopausal bleeding: Secondary | ICD-10-CM | POA: Diagnosis not present

## 2023-10-22 DIAGNOSIS — N939 Abnormal uterine and vaginal bleeding, unspecified: Secondary | ICD-10-CM

## 2023-10-22 DIAGNOSIS — N938 Other specified abnormal uterine and vaginal bleeding: Secondary | ICD-10-CM | POA: Insufficient documentation

## 2023-10-22 DIAGNOSIS — Z79899 Other long term (current) drug therapy: Secondary | ICD-10-CM | POA: Diagnosis not present

## 2023-10-22 DIAGNOSIS — N840 Polyp of corpus uteri: Secondary | ICD-10-CM | POA: Diagnosis not present

## 2023-10-22 DIAGNOSIS — Z01818 Encounter for other preprocedural examination: Secondary | ICD-10-CM

## 2023-10-22 HISTORY — DX: Prediabetes: R73.03

## 2023-10-22 HISTORY — DX: Other specified postprocedural states: Z98.890

## 2023-10-22 HISTORY — DX: Other complications of anesthesia, initial encounter: T88.59XA

## 2023-10-22 HISTORY — DX: Presence of spectacles and contact lenses: Z97.3

## 2023-10-22 HISTORY — PX: DILATATION & CURETTAGE/HYSTEROSCOPY WITH MYOSURE: SHX6511

## 2023-10-22 LAB — CBC
HCT: 42.9 % (ref 36.0–46.0)
Hemoglobin: 13.7 g/dL (ref 12.0–15.0)
MCH: 28.1 pg (ref 26.0–34.0)
MCHC: 31.9 g/dL (ref 30.0–36.0)
MCV: 88.1 fL (ref 80.0–100.0)
Platelets: 300 10*3/uL (ref 150–400)
RBC: 4.87 MIL/uL (ref 3.87–5.11)
RDW: 14.2 % (ref 11.5–15.5)
WBC: 6.1 10*3/uL (ref 4.0–10.5)
nRBC: 0 % (ref 0.0–0.2)

## 2023-10-22 LAB — POCT I-STAT, CHEM 8
BUN: 17 mg/dL (ref 8–23)
Calcium, Ion: 1.25 mmol/L (ref 1.15–1.40)
Chloride: 101 mmol/L (ref 98–111)
Creatinine, Ser: 0.8 mg/dL (ref 0.44–1.00)
Glucose, Bld: 110 mg/dL — ABNORMAL HIGH (ref 70–99)
HCT: 44 % (ref 36.0–46.0)
Hemoglobin: 15 g/dL (ref 12.0–15.0)
Potassium: 2.9 mmol/L — ABNORMAL LOW (ref 3.5–5.1)
Sodium: 141 mmol/L (ref 135–145)
TCO2: 27 mmol/L (ref 22–32)

## 2023-10-22 LAB — TYPE AND SCREEN
ABO/RH(D): O POS
Antibody Screen: NEGATIVE

## 2023-10-22 LAB — ABO/RH: ABO/RH(D): O POS

## 2023-10-22 SURGERY — DILATATION & CURETTAGE/HYSTEROSCOPY WITH MYOSURE
Anesthesia: General | Site: Uterus

## 2023-10-22 MED ORDER — SODIUM CHLORIDE 0.9% FLUSH: 3.0000 mL | Freq: Two times a day (BID) | INTRAVENOUS | Status: DC

## 2023-10-22 MED ORDER — LACTATED RINGERS IV SOLN: INTRAVENOUS | Status: DC | PRN

## 2023-10-22 MED ORDER — DEXMEDETOMIDINE HCL IN NACL 80 MCG/20ML IV SOLN
INTRAVENOUS | Status: DC | PRN
  Administered 2023-10-22: 4 ug via INTRAVENOUS

## 2023-10-22 MED ORDER — ONDANSETRON HCL 4 MG/2ML IJ SOLN
INTRAMUSCULAR | Status: DC | PRN
  Administered 2023-10-22: 4 mg via INTRAVENOUS

## 2023-10-22 MED ORDER — POVIDONE-IODINE 10 % EX SWAB: 2.0000 | Freq: Once | CUTANEOUS | Status: DC

## 2023-10-22 MED ORDER — DEXAMETHASONE SODIUM PHOSPHATE 4 MG/ML IJ SOLN
INTRAMUSCULAR | Status: DC | PRN
  Administered 2023-10-22: 5 mg via INTRAVENOUS

## 2023-10-22 MED ORDER — SODIUM CHLORIDE 0.9 % IR SOLN
Status: DC | PRN
  Administered 2023-10-22: 3000 mL

## 2023-10-22 MED ORDER — MIDAZOLAM HCL 2 MG/2ML IJ SOLN
INTRAMUSCULAR | Status: AC
Start: 2023-10-22 — End: ?
  Filled 2023-10-22: qty 2

## 2023-10-22 MED ORDER — LIDOCAINE HCL 1 % IJ SOLN
INTRAMUSCULAR | Status: DC | PRN
  Administered 2023-10-22: 10 mL

## 2023-10-22 MED ORDER — LIDOCAINE HCL (PF) 2 % IJ SOLN
INTRAMUSCULAR | Status: AC
  Filled 2023-10-22: qty 5

## 2023-10-22 MED ORDER — ACETAMINOPHEN 10 MG/ML IV SOLN
INTRAVENOUS | Status: AC
Start: 2023-10-22 — End: ?
  Filled 2023-10-22: qty 100

## 2023-10-22 MED ORDER — KETOROLAC TROMETHAMINE 30 MG/ML IJ SOLN
INTRAMUSCULAR | Status: DC | PRN
  Administered 2023-10-22: 30 mg via INTRAVENOUS

## 2023-10-22 MED ORDER — ACETAMINOPHEN 10 MG/ML IV SOLN: 1000.0000 mg | Freq: Once | INTRAVENOUS | Status: DC | PRN

## 2023-10-22 MED ORDER — ONDANSETRON HCL 4 MG/2ML IJ SOLN: 4.0000 mg | Freq: Once | INTRAMUSCULAR | Status: DC | PRN

## 2023-10-22 MED ORDER — FENTANYL CITRATE (PF) 100 MCG/2ML IJ SOLN
INTRAMUSCULAR | Status: DC | PRN
  Administered 2023-10-22: 50 ug via INTRAVENOUS

## 2023-10-22 MED ORDER — LIDOCAINE HCL (CARDIAC) PF 100 MG/5ML IV SOSY
PREFILLED_SYRINGE | INTRAVENOUS | Status: DC | PRN
  Administered 2023-10-22: 50 mg via INTRAVENOUS

## 2023-10-22 MED ORDER — SODIUM CHLORIDE 0.9 % IV SOLN: INTRAVENOUS | Status: DC

## 2023-10-22 MED ORDER — FENTANYL CITRATE (PF) 100 MCG/2ML IJ SOLN
INTRAMUSCULAR | Status: AC
  Filled 2023-10-22: qty 2

## 2023-10-22 MED ORDER — LACTATED RINGERS IV SOLN: INTRAVENOUS | Status: DC

## 2023-10-22 MED ORDER — KETOROLAC TROMETHAMINE 15 MG/ML IJ SOLN: 15.0000 mg | Freq: Once | INTRAMUSCULAR | Status: DC | PRN

## 2023-10-22 MED ORDER — PROPOFOL 10 MG/ML IV BOLUS
INTRAVENOUS | Status: DC | PRN
  Administered 2023-10-22: 200 mg via INTRAVENOUS

## 2023-10-22 MED ORDER — EPHEDRINE 5 MG/ML INJ
INTRAVENOUS | Status: AC
  Filled 2023-10-22: qty 5

## 2023-10-22 MED ORDER — SODIUM CHLORIDE 0.9 % IV SOLN: INTRAVENOUS | Status: DC | PRN

## 2023-10-22 MED ORDER — AMISULPRIDE (ANTIEMETIC) 5 MG/2ML IV SOLN: 10.0000 mg | Freq: Once | INTRAVENOUS | Status: DC | PRN

## 2023-10-22 MED ORDER — ACETAMINOPHEN 10 MG/ML IV SOLN
INTRAVENOUS | Status: DC | PRN
  Administered 2023-10-22: 1000 mg via INTRAVENOUS

## 2023-10-22 MED ORDER — FENTANYL CITRATE (PF) 100 MCG/2ML IJ SOLN: 25.0000 ug | INTRAMUSCULAR | Status: DC | PRN

## 2023-10-22 MED ORDER — MIDAZOLAM HCL 2 MG/2ML IJ SOLN
INTRAMUSCULAR | Status: DC | PRN
  Administered 2023-10-22: 1 mg via INTRAVENOUS

## 2023-10-22 SURGICAL SUPPLY — 18 items
CATH ROBINSON RED A/P 16FR (CATHETERS) IMPLANT
DEVICE MYOSURE LITE (MISCELLANEOUS) IMPLANT
DEVICE MYOSURE REACH (MISCELLANEOUS) IMPLANT
DILATOR CANAL MILEX (MISCELLANEOUS) IMPLANT
GAUZE 4X4 16PLY ~~LOC~~+RFID DBL (SPONGE) IMPLANT
GLOVE BIO SURGEON STRL SZ7 (GLOVE) ×1 IMPLANT
GOWN STRL REUS W/TWL LRG LVL3 (GOWN DISPOSABLE) ×1 IMPLANT
IV NS IRRIG 3000ML ARTHROMATIC (IV SOLUTION) ×1 IMPLANT
KIT PROCEDURE FLUENT (KITS) ×1 IMPLANT
KIT TURNOVER CYSTO (KITS) ×1 IMPLANT
MYOSURE XL FIBROID (MISCELLANEOUS)
PACK VAGINAL MINOR WOMEN LF (CUSTOM PROCEDURE TRAY) ×1 IMPLANT
PAD OB MATERNITY 4.3X12.25 (PERSONAL CARE ITEMS) ×1 IMPLANT
PAD PREP 24X48 CUFFED NSTRL (MISCELLANEOUS) ×1 IMPLANT
SEAL CERVICAL OMNI LOK (ABLATOR) IMPLANT
SEAL ROD LENS SCOPE MYOSURE (ABLATOR) ×1 IMPLANT
SLEEVE SCD COMPRESS KNEE MED (STOCKING) ×1 IMPLANT
SYSTEM TISS REMOVAL MYOSURE XL (MISCELLANEOUS) IMPLANT

## 2023-10-22 NOTE — Anesthesia Postprocedure Evaluation (Signed)
Anesthesia Post Note  Patient: Mariah [name]  Procedure(s) Performed: DILATATION & CURETTAGE/HYSTEROSCOPY WITH MYOSURE (Uterus)     Patient location during evaluation: PACU Anesthesia Type: General Level of consciousness: awake Pain management: pain level controlled Vital Signs Assessment: post-procedure vital signs reviewed and stable Respiratory status: spontaneous breathing, nonlabored ventilation and respiratory function stable Cardiovascular status: blood pressure returned to baseline and stable Postop Assessment: no apparent nausea or vomiting Anesthetic complications: no   No notable events documented.  Last Vitals:  Vitals:   10/22/23 0900 10/22/23 0927  BP: 112/65 131/79  Pulse: (!) 54 (!) 56  Resp: 16 18  Temp: (!) 36.3 C   SpO2: 96% 98%    Last Pain:  Vitals:   10/22/23 0927  TempSrc:   PainSc: 0-No pain                 Anahita Cua P Catha Ontko

## 2023-10-22 NOTE — Progress Notes (Signed)
The patient was re-examined with no change in status 

## 2023-10-22 NOTE — Anesthesia Preprocedure Evaluation (Addendum)
Anesthesia Evaluation  Patient identified by MRN, date of birth, ID band Patient awake    Reviewed: Allergy & Precautions, NPO status , Patient's Chart, lab work & pertinent test results  Airway Mallampati: III  TM Distance: >3 FB Neck ROM: Full    Dental  (+) Chipped,    Pulmonary neg pulmonary ROS   Pulmonary exam normal        Cardiovascular hypertension, Pt. on home beta blockers and Pt. on medications Normal cardiovascular exam Rate:Normal     Neuro/Psych  Headaches  negative psych ROS   GI/Hepatic negative GI ROS, Neg liver ROS,,,  Endo/Other    Class 3 obesityPatient on GLP-1 Agonist hypokalemia  Renal/GU negative Renal ROS     Musculoskeletal  (+) Arthritis ,    Abdominal  (+) + obese  Peds  Hematology negative hematology ROS (+)   Anesthesia Other Findings POSTMENOPAUSAL BLEEDING  Reproductive/Obstetrics                             Anesthesia Physical Anesthesia Plan  ASA: 3  Anesthesia Plan: General   Post-op Pain Management:    Induction: Intravenous  PONV Risk Score and Plan: 4 or greater and Ondansetron, Dexamethasone, Midazolam and Treatment may vary due to age or medical condition  Airway Management Planned: Oral ETT  Additional Equipment:   Intra-op Plan:   Post-operative Plan: Extubation in OR  Informed Consent: I have reviewed the patients History and Physical, chart, labs and discussed the procedure including the risks, benefits and alternatives for the proposed anesthesia with the patient or authorized representative who has indicated his/her understanding and acceptance.     Dental advisory given  Plan Discussed with: CRNA and Surgeon  Anesthesia Plan Comments: (Preference for general anesthesia discussed with patient, family, CRNA, and surgeon. All questions answered. - RE )       Anesthesia Quick Evaluation

## 2023-10-22 NOTE — Op Note (Signed)
Preoperative diagnosis: Abnormal uterine bleeding, endometrial polyp  Postoperative diagnosis: Same  Procedure: Hysteroscopy with MyoSure resection of 2 endometrial polyps  Surgeon: Sherryll Burger  Anesthesia: General  EBL: Less than 10 cc  Procedure findings  The patient to take to the operating room after an adequate level of general anesthesia was obtained with the legs in stirrups appropriate timeouts were taken and then she was prepped and draped she voided prior to coming back to the OR.  Cervix was grasped with a tenaculum with anterior lip paracervical blockade by infiltrating 3 9:00 submucosally 5 to 7 cc 1% plain Xylocaine at each site after negative aspiration.  Uterus sounded to 8-1/2 cm progressively dilated to 21/22 Pratt dilator, the continuous-flow hysteroscope was then inserted to well visualize benign appearing polyps the remainder of the cavity was unremarkable.  The MyoSure lite was brought into position and the 2 polyps were resected completely there was very minimal bleeding the cavity was clean at the end of the procedure.  Specimen sent to pathology she tolerated this well went to recovery room in good condition  Dictated with Dragon Medical One  Meriel Pica MD

## 2023-10-22 NOTE — Anesthesia Procedure Notes (Signed)
Procedure Name: LMA Insertion Date/Time: 10/22/2023 7:55 AM  Performed by: Earmon Phoenix, CRNAPre-anesthesia Checklist: Patient identified, Emergency Drugs available, Suction available, Patient being monitored and Timeout performed Patient Re-evaluated:Patient Re-evaluated prior to induction Oxygen Delivery Method: Circle system utilized Preoxygenation: Pre-oxygenation with 100% oxygen Induction Type: IV induction Ventilation: Mask ventilation without difficulty LMA: LMA with gastric port inserted LMA Size: 4.0 Placement Confirmation: positive ETCO2 and breath sounds checked- equal and bilateral Tube secured with: Tape Dental Injury: Teeth and Oropharynx as per pre-operative assessment

## 2023-10-22 NOTE — Transfer of Care (Signed)
Immediate Anesthesia Transfer of Care Note  Patient: Mariah [name]  Procedure(s) Performed: DILATATION & CURETTAGE/HYSTEROSCOPY WITH MYOSURE (Uterus)  Patient Location: PACU  Anesthesia Type:General  Level of Consciousness: awake, alert , oriented, and patient cooperative  Airway & Oxygen Therapy: Patient Spontanous Breathing and Patient connected to nasal cannula oxygen  Post-op Assessment: Report given to RN and Post -op Vital signs reviewed and stable  Post vital signs: Reviewed and stable  Last Vitals:  Vitals Value Taken Time  BP 124/64 10/22/23 0827  Temp    Pulse 57 10/22/23 0830  Resp 17 10/22/23 0830  SpO2 100 % 10/22/23 0830  Vitals shown include unfiled device data.  Last Pain:  Vitals:   10/22/23 0607  TempSrc: Oral  PainSc: 0-No pain      Patients Stated Pain Goal: 1 (10/22/23 1610)  Complications: No notable events documented.

## 2023-10-22 NOTE — Discharge Instructions (Addendum)
  Post Anesthesia Home Care Instructions  Activity: Get plenty of rest for the remainder of the day. A responsible individual must stay with you for 24 hours following the procedure.  For the next 24 hours, DO NOT: -Drive a car -Advertising copywriter -Drink alcoholic beverages -Take any medication unless instructed by your physician -Make any legal decisions or sign important papers.  Meals: Start with liquid foods such as gelatin or soup. Progress to regular foods as tolerated. Avoid greasy, spicy, heavy foods. If nausea and/or vomiting occur, drink only clear liquids until the nausea and/or vomiting subsides. Call your physician if vomiting continues.  Special Instructions/Symptoms: Your throat may feel dry or sore from the anesthesia or the breathing tube placed in your throat during surgery. If this causes discomfort, gargle with warm salt water. The discomfort should disappear within 24 hours.  If needed, do not take any nonsteroidal anti inflammatories until after 2:17 pm.  Prevent constipation:  Drink plenty of water, eat a high fiber diet, take an over the counter stool softerner if needed. If constipated and that doesn't help take a laxative and call your Doctor if that doesn't work.  Call your doctor if you have sign/symptoms of infection such as fever above 100 degrees F, unusual vaginal drainage, or drainage that smells bad.  Get help immediately if you have heavy vaginal bleeding (saturating a pad in one hour and repeating the next hour).

## 2023-10-23 ENCOUNTER — Encounter (HOSPITAL_BASED_OUTPATIENT_CLINIC_OR_DEPARTMENT_OTHER): Payer: Self-pay | Admitting: Obstetrics and Gynecology

## 2023-10-23 LAB — SURGICAL PATHOLOGY

## 2023-11-05 ENCOUNTER — Ambulatory Visit
Admission: RE | Admit: 2023-11-05 | Discharge: 2023-11-05 | Disposition: A | Discharge status: Home / Self Care | Payer: Medicare HMO | Source: Ambulatory Visit | Attending: Family Medicine | Admitting: Family Medicine

## 2023-11-05 DIAGNOSIS — Z1231 Encounter for screening mammogram for malignant neoplasm of breast: Secondary | ICD-10-CM

## 2023-12-19 ENCOUNTER — Other Ambulatory Visit: Payer: Self-pay | Admitting: Family Medicine

## 2023-12-19 DIAGNOSIS — I10 Essential (primary) hypertension: Secondary | ICD-10-CM

## 2024-05-31 ENCOUNTER — Other Ambulatory Visit: Payer: Self-pay | Admitting: Internal Medicine

## 2024-05-31 DIAGNOSIS — I1A0 Resistant hypertension: Secondary | ICD-10-CM

## 2024-06-15 ENCOUNTER — Encounter: Payer: Self-pay | Admitting: Internal Medicine

## 2024-06-15 ENCOUNTER — Other Ambulatory Visit

## 2024-06-17 ENCOUNTER — Ambulatory Visit
Admission: RE | Admit: 2024-06-17 | Discharge: 2024-06-17 | Disposition: A | Discharge status: Home / Self Care | Source: Ambulatory Visit | Attending: Internal Medicine | Admitting: Internal Medicine

## 2024-06-17 DIAGNOSIS — I1A0 Resistant hypertension: Secondary | ICD-10-CM
